# Patient Record
Sex: Female | Born: 1990 | Race: Asian | Hispanic: No | Marital: Married | Smoking: Never smoker
Health system: Southern US, Community
[De-identification: ages and names within clinical notes are randomized; demographics above are authoritative.]

## PROBLEM LIST (undated history)

## (undated) ENCOUNTER — Inpatient Hospital Stay (HOSPITAL_COMMUNITY): Payer: Self-pay

## (undated) DIAGNOSIS — Z789 Other specified health status: Secondary | ICD-10-CM

## (undated) DIAGNOSIS — O139 Gestational [pregnancy-induced] hypertension without significant proteinuria, unspecified trimester: Secondary | ICD-10-CM

## (undated) HISTORY — PX: NO PAST SURGERIES: SHX2092

---

## 2012-07-22 NOTE — L&D Delivery Note (Signed)
Delivery Note Pt rapidly progressed to complete dilation and pushed well.  At 6:03 AM a viable female was delivered via Vaginal, Spontaneous Delivery (Presentation: OA ).  APGARS to be assigned by NICU but baby crying vigorously; weight pending.   Placenta status:delivered spontaneously , .  Cord:  with the following complications: none  Anesthesia: None  Episiotomy: none Lacerations: none Suture Repair: N/a Est. Blood Loss (mL): 300cc  Mom to AICU.  Baby to NICU.  Oliver Pila 06/21/2013, 6:14 AM

## 2013-04-02 LAB — OB RESULTS CONSOLE ABO/RH: RH Type: POSITIVE

## 2013-04-02 LAB — OB RESULTS CONSOLE RUBELLA ANTIBODY, IGM: Rubella: IMMUNE

## 2013-04-02 LAB — OB RESULTS CONSOLE RPR: RPR: NONREACTIVE

## 2013-04-02 LAB — OB RESULTS CONSOLE HIV ANTIBODY (ROUTINE TESTING): HIV: NONREACTIVE

## 2013-04-06 LAB — OB RESULTS CONSOLE GC/CHLAMYDIA
Chlamydia: NEGATIVE
Gonorrhea: NEGATIVE

## 2013-05-20 LAB — OB RESULTS CONSOLE GBS: GBS: POSITIVE

## 2013-06-21 ENCOUNTER — Inpatient Hospital Stay (HOSPITAL_COMMUNITY)
Admission: AD | Admit: 2013-06-21 | Discharge: 2013-06-25 | DRG: 774 | Disposition: A | Discharge status: Home / Self Care | Payer: Medicaid Other | Source: Ambulatory Visit | Attending: Obstetrics and Gynecology | Admitting: Obstetrics and Gynecology

## 2013-06-21 ENCOUNTER — Inpatient Hospital Stay (HOSPITAL_COMMUNITY): Payer: Medicaid Other

## 2013-06-21 ENCOUNTER — Encounter (HOSPITAL_COMMUNITY): Payer: Self-pay | Admitting: *Deleted

## 2013-06-21 DIAGNOSIS — O99892 Other specified diseases and conditions complicating childbirth: Secondary | ICD-10-CM | POA: Diagnosis present

## 2013-06-21 DIAGNOSIS — D649 Anemia, unspecified: Secondary | ICD-10-CM | POA: Diagnosis not present

## 2013-06-21 DIAGNOSIS — IMO0002 Reserved for concepts with insufficient information to code with codable children: Principal | ICD-10-CM | POA: Diagnosis present

## 2013-06-21 DIAGNOSIS — Z22330 Carrier of Group B streptococcus: Secondary | ICD-10-CM

## 2013-06-21 DIAGNOSIS — Q513 Bicornate uterus: Secondary | ICD-10-CM

## 2013-06-21 DIAGNOSIS — O3400 Maternal care for unspecified congenital malformation of uterus, unspecified trimester: Secondary | ICD-10-CM | POA: Diagnosis present

## 2013-06-21 DIAGNOSIS — O2650 Maternal hypotension syndrome, unspecified trimester: Secondary | ICD-10-CM | POA: Diagnosis present

## 2013-06-21 DIAGNOSIS — O1490 Unspecified pre-eclampsia, unspecified trimester: Secondary | ICD-10-CM | POA: Diagnosis present

## 2013-06-21 DIAGNOSIS — O9903 Anemia complicating the puerperium: Secondary | ICD-10-CM | POA: Diagnosis not present

## 2013-06-21 DIAGNOSIS — O1493 Unspecified pre-eclampsia, third trimester: Secondary | ICD-10-CM

## 2013-06-21 HISTORY — DX: Other specified health status: Z78.9

## 2013-06-21 LAB — CBC WITH DIFFERENTIAL/PLATELET
Basophils Absolute: 0.1 10*3/uL (ref 0.0–0.1)
Basophils Relative: 0 % (ref 0–1)
Eosinophils Absolute: 0.7 10*3/uL (ref 0.0–0.7)
Eosinophils Relative: 5 % (ref 0–5)
Lymphocytes Relative: 24 % (ref 12–46)
Lymphs Abs: 3 10*3/uL (ref 0.7–4.0)
MCH: 25.6 pg — ABNORMAL LOW (ref 26.0–34.0)
MCV: 77.3 fL — ABNORMAL LOW (ref 78.0–100.0)
Neutrophils Relative %: 61 % (ref 43–77)
Platelets: 223 10*3/uL (ref 150–400)
RBC: 4.84 MIL/uL (ref 3.87–5.11)
RDW: 14 % (ref 11.5–15.5)
WBC: 12.6 10*3/uL — ABNORMAL HIGH (ref 4.0–10.5)

## 2013-06-21 LAB — COMPREHENSIVE METABOLIC PANEL
ALT: 18 U/L (ref 0–35)
AST: 28 U/L (ref 0–37)
AST: 34 U/L (ref 0–37)
Albumin: 2.3 g/dL — ABNORMAL LOW (ref 3.5–5.2)
Albumin: 2.3 g/dL — ABNORMAL LOW (ref 3.5–5.2)
Alkaline Phosphatase: 145 U/L — ABNORMAL HIGH (ref 39–117)
BUN: 8 mg/dL (ref 6–23)
CO2: 21 mEq/L (ref 19–32)
CO2: 20 mEq/L (ref 19–32)
Calcium: 8.5 mg/dL (ref 8.4–10.5)
Calcium: 8.3 mg/dL — ABNORMAL LOW (ref 8.4–10.5)
Chloride: 104 mEq/L (ref 96–112)
Creatinine, Ser: 0.63 mg/dL (ref 0.50–1.10)
GFR calc Af Amer: >90 mL/min (ref >90)
GFR calc non Af Amer: >90 mL/min (ref >90)
Glucose, Bld: 79 mg/dL (ref 70–99)
Glucose, Bld: 80 mg/dL (ref 70–99)
Potassium: 4.1 mEq/L (ref 3.5–5.1)
Sodium: 135 mEq/L (ref 135–145)
Total Bilirubin: 0.3 mg/dL (ref 0.3–1.2)
Total Protein: 5.7 g/dL — ABNORMAL LOW (ref 6.0–8.3)

## 2013-06-21 LAB — CBC
HCT: 35 % — ABNORMAL LOW (ref 36.0–46.0)
Hemoglobin: 12.5 g/dL (ref 12.0–15.0)
Hemoglobin: 11.5 g/dL — ABNORMAL LOW (ref 12.0–15.0)
MCH: 25.5 pg — ABNORMAL LOW (ref 26.0–34.0)
MCH: 25.6 pg — ABNORMAL LOW (ref 26.0–34.0)
MCHC: 32.8 g/dL (ref 30.0–36.0)
MCHC: 32.9 g/dL (ref 30.0–36.0)
MCV: 76.7 fL — ABNORMAL LOW (ref 78.0–100.0)
Platelets: 182 10*3/uL (ref 150–400)
RBC: 3.77 MIL/uL — ABNORMAL LOW (ref 3.87–5.11)
RDW: 14 % (ref 11.5–15.5)
WBC: 14.4 10*3/uL — ABNORMAL HIGH (ref 4.0–10.5)
WBC: 16.2 10*3/uL — ABNORMAL HIGH (ref 4.0–10.5)

## 2013-06-21 LAB — URINE MICROSCOPIC-ADD ON

## 2013-06-21 LAB — URIC ACID: Uric Acid, Serum: 7.3 mg/dL — ABNORMAL HIGH (ref 2.4–7.0)

## 2013-06-21 LAB — URINALYSIS, ROUTINE W REFLEX MICROSCOPIC
Glucose, UA: NEGATIVE mg/dL
Ketones, ur: NEGATIVE mg/dL
Leukocytes, UA: NEGATIVE
Specific Gravity, Urine: 1.025 (ref 1.005–1.030)
pH: 6.5 (ref 5.0–8.0)

## 2013-06-21 LAB — RPR: RPR Ser Ql: NONREACTIVE

## 2013-06-21 LAB — PROTEIN / CREATININE RATIO, URINE
Creatinine, Urine: 68.87 mg/dL
Protein Creatinine Ratio: 10.69 — ABNORMAL HIGH (ref 0.00–0.15)
Total Protein, Urine: 735.9 mg/dL

## 2013-06-21 LAB — TYPE AND SCREEN
ABO/RH(D): A POS
Antibody Screen: NEGATIVE

## 2013-06-21 MED ORDER — TETANUS-DIPHTH-ACELL PERTUSSIS 5-2.5-18.5 LF-MCG/0.5 IM SUSP
0.5000 mL | Freq: Once | INTRAMUSCULAR | Status: AC
  Administered 2013-06-22: 0.5 mL via INTRAMUSCULAR
  Filled 2013-06-21: qty 0.5

## 2013-06-21 MED ORDER — MAGNESIUM SULFATE 40 MG/ML IJ SOLN
4.0000 g | Freq: Once | INTRAMUSCULAR | Status: DC
  Administered 2013-06-21: 4 g via INTRAVENOUS

## 2013-06-21 MED ORDER — BUTORPHANOL TARTRATE 1 MG/ML IJ SOLN
1.0000 mg | Freq: Once | INTRAMUSCULAR | Status: AC
  Administered 2013-06-21: 1 mg via INTRAVENOUS
  Filled 2013-06-21: qty 1

## 2013-06-21 MED ORDER — PENICILLIN G POTASSIUM 5000000 UNITS IJ SOLR
2.5000 10*6.[IU] | INTRAVENOUS | Status: DC
  Filled 2013-06-21 (×2): qty 2.5

## 2013-06-21 MED ORDER — WITCH HAZEL-GLYCERIN EX PADS: 1.0000 "application " | MEDICATED_PAD | CUTANEOUS | Status: DC | PRN

## 2013-06-21 MED ORDER — PRENATAL MULTIVITAMIN CH
1.0000 | ORAL_TABLET | Freq: Every day | ORAL | Status: DC
  Administered 2013-06-21 – 2013-06-25 (×5): 1 via ORAL
  Filled 2013-06-21 (×6): qty 1

## 2013-06-21 MED ORDER — LACTATED RINGERS IV SOLN
INTRAVENOUS | Status: DC
  Administered 2013-06-21: 01:00:00 via INTRAVENOUS

## 2013-06-21 MED ORDER — ONDANSETRON HCL 4 MG/2ML IJ SOLN: 4.0000 mg | INTRAMUSCULAR | Status: DC | PRN

## 2013-06-21 MED ORDER — OXYTOCIN 40 UNITS IN LACTATED RINGERS INFUSION - SIMPLE MED
62.5000 mL/h | INTRAVENOUS | Status: DC
  Administered 2013-06-21: 62.5 mL/h via INTRAVENOUS
  Filled 2013-06-21: qty 1000

## 2013-06-21 MED ORDER — IBUPROFEN 600 MG PO TABS
600.0000 mg | ORAL_TABLET | Freq: Four times a day (QID) | ORAL | Status: DC | PRN
  Administered 2013-06-21: 600 mg via ORAL
  Filled 2013-06-21: qty 1

## 2013-06-21 MED ORDER — ONDANSETRON HCL 4 MG PO TABS: 4.0000 mg | ORAL_TABLET | ORAL | Status: DC | PRN

## 2013-06-21 MED ORDER — OXYTOCIN 40 UNITS IN LACTATED RINGERS INFUSION - SIMPLE MED
INTRAVENOUS | Status: AC
  Administered 2013-06-21: 40 [IU]
  Filled 2013-06-21: qty 1000

## 2013-06-21 MED ORDER — MISOPROSTOL 200 MCG PO TABS
ORAL_TABLET | ORAL | Status: AC
  Filled 2013-06-21: qty 3

## 2013-06-21 MED ORDER — DIPHENHYDRAMINE HCL 25 MG PO CAPS: 25.0000 mg | ORAL_CAPSULE | Freq: Four times a day (QID) | ORAL | Status: DC | PRN

## 2013-06-21 MED ORDER — MAGNESIUM SULFATE 40 G IN LACTATED RINGERS - SIMPLE: 2.0000 g/h | INTRAVENOUS | Status: DC

## 2013-06-21 MED ORDER — PENICILLIN G POTASSIUM 5000000 UNITS IJ SOLR
5.0000 10*6.[IU] | Freq: Once | INTRAVENOUS | Status: AC
  Administered 2013-06-21: 5 10*6.[IU] via INTRAVENOUS
  Filled 2013-06-21: qty 5

## 2013-06-21 MED ORDER — LACTATED RINGERS IV SOLN: 500.0000 mL | INTRAVENOUS | Status: DC | PRN

## 2013-06-21 MED ORDER — SIMETHICONE 80 MG PO CHEW: 80.0000 mg | CHEWABLE_TABLET | ORAL | Status: DC | PRN

## 2013-06-21 MED ORDER — DIBUCAINE 1 % RE OINT
1.0000 "application " | TOPICAL_OINTMENT | RECTAL | Status: DC | PRN
  Filled 2013-06-21: qty 28

## 2013-06-21 MED ORDER — LIDOCAINE HCL (PF) 1 % IJ SOLN
5.0000 mL | Freq: Once | INTRAMUSCULAR | Status: AC
  Administered 2013-06-21: 5 mL via SUBCUTANEOUS
  Filled 2013-06-21: qty 5

## 2013-06-21 MED ORDER — LACTATED RINGERS IV SOLN
INTRAVENOUS | Status: DC
  Administered 2013-06-21: 05:00:00 via INTRAVENOUS

## 2013-06-21 MED ORDER — ONDANSETRON HCL 4 MG/2ML IJ SOLN: 4.0000 mg | Freq: Four times a day (QID) | INTRAMUSCULAR | Status: DC | PRN

## 2013-06-21 MED ORDER — IBUPROFEN 600 MG PO TABS
600.0000 mg | ORAL_TABLET | Freq: Four times a day (QID) | ORAL | Status: DC
  Administered 2013-06-21 – 2013-06-25 (×14): 600 mg via ORAL
  Filled 2013-06-21 (×15): qty 1

## 2013-06-21 MED ORDER — CARBOPROST TROMETHAMINE 250 MCG/ML IM SOLN
250.0000 ug | Freq: Once | INTRAMUSCULAR | Status: AC
  Administered 2013-06-21: 250 ug via INTRAMUSCULAR
  Filled 2013-06-21: qty 1

## 2013-06-21 MED ORDER — BUTORPHANOL TARTRATE 1 MG/ML IJ SOLN
1.0000 mg | Freq: Once | INTRAMUSCULAR | Status: AC
  Administered 2013-06-22: 1 mg via INTRAVENOUS
  Filled 2013-06-21: qty 1

## 2013-06-21 MED ORDER — ACETAMINOPHEN 325 MG PO TABS: 650.0000 mg | ORAL_TABLET | ORAL | Status: DC | PRN

## 2013-06-21 MED ORDER — MISOPROSTOL 200 MCG PO TABS
400.0000 ug | ORAL_TABLET | Freq: Once | ORAL | Status: AC
  Administered 2013-06-21: 400 ug via VAGINAL

## 2013-06-21 MED ORDER — DIPHENOXYLATE-ATROPINE 2.5-0.025 MG PO TABS
2.0000 | ORAL_TABLET | Freq: Four times a day (QID) | ORAL | Status: DC | PRN
  Administered 2013-06-22: 2 via ORAL

## 2013-06-21 MED ORDER — MAGNESIUM SULFATE BOLUS VIA INFUSION
4.0000 g | Freq: Once | INTRAVENOUS | Status: DC
  Filled 2013-06-21: qty 500

## 2013-06-21 MED ORDER — BENZOCAINE-MENTHOL 20-0.5 % EX AERO
1.0000 "application " | INHALATION_SPRAY | CUTANEOUS | Status: DC | PRN
  Filled 2013-06-21: qty 56

## 2013-06-21 MED ORDER — LIDOCAINE HCL (PF) 1 % IJ SOLN
30.0000 mL | INTRAMUSCULAR | Status: DC | PRN
  Filled 2013-06-21: qty 30

## 2013-06-21 MED ORDER — ZOLPIDEM TARTRATE 5 MG PO TABS: 5.0000 mg | ORAL_TABLET | Freq: Every evening | ORAL | Status: DC | PRN

## 2013-06-21 MED ORDER — BUTORPHANOL TARTRATE 1 MG/ML IJ SOLN
1.0000 mg | INTRAMUSCULAR | Status: DC | PRN
  Administered 2013-06-21: 1 mg via INTRAVENOUS
  Filled 2013-06-21: qty 1

## 2013-06-21 MED ORDER — SENNOSIDES-DOCUSATE SODIUM 8.6-50 MG PO TABS
2.0000 | ORAL_TABLET | ORAL | Status: DC
  Administered 2013-06-24: 2 via ORAL
  Filled 2013-06-21: qty 2

## 2013-06-21 MED ORDER — OXYCODONE-ACETAMINOPHEN 5-325 MG PO TABS
1.0000 | ORAL_TABLET | ORAL | Status: DC | PRN
Start: 2013-06-21 — End: 2013-06-21

## 2013-06-21 MED ORDER — OXYCODONE-ACETAMINOPHEN 5-325 MG PO TABS
1.0000 | ORAL_TABLET | ORAL | Status: DC | PRN
Start: 2013-06-21 — End: 2013-06-25
  Administered 2013-06-22 (×2): 2 via ORAL
  Administered 2013-06-23: 1 via ORAL
  Filled 2013-06-21: qty 1
  Filled 2013-06-21 (×2): qty 2

## 2013-06-21 MED ORDER — CITRIC ACID-SODIUM CITRATE 334-500 MG/5ML PO SOLN: 30.0000 mL | ORAL | Status: DC | PRN

## 2013-06-21 MED ORDER — SODIUM CHLORIDE 0.45 % IV SOLN
INTRAVENOUS | Status: DC
  Administered 2013-06-21: 19:00:00 via INTRAVENOUS

## 2013-06-21 MED ORDER — OXYTOCIN BOLUS FROM INFUSION
500.0000 mL | INTRAVENOUS | Status: DC
  Administered 2013-06-21: 500 mL via INTRAVENOUS

## 2013-06-21 MED ORDER — LANOLIN HYDROUS EX OINT: TOPICAL_OINTMENT | CUTANEOUS | Status: DC | PRN

## 2013-06-21 MED ORDER — MAGNESIUM SULFATE 40 G IN LACTATED RINGERS - SIMPLE
2.0000 g/h | INTRAVENOUS | Status: DC
  Administered 2013-06-21 (×2): 2 g/h via INTRAVENOUS
  Filled 2013-06-21 (×2): qty 500

## 2013-06-21 NOTE — MAU Note (Signed)
Via EMS, limited English, EMS reports elevated B/P's in route. Per family pt has only history of kidney stones with this preg.

## 2013-06-21 NOTE — Progress Notes (Signed)
Patient ID: Stephanie Holmes, female   DOB: Mar 16, 1991, 22 y.o.   MRN: [id number] The pt had a syncopal episode in the restroom and was incontinent of stool.When she returned to her bed by wheelchair I examined her and found no blood clots in her uterus although the pt did not allow a good exam. Some material I took out looked like possible placental tissue. I will check her CBC and get an [place] to look for retained placenta. Her VS have been normal

## 2013-06-21 NOTE — MAU Provider Note (Signed)
History     CSN: [id number]  Arrival date and time: 06/21/13 0015   None     Chief Complaint  Patient presents with  . Abdominal Pain   HPI  Stephanie Holmes is a 22 y.o. G1P0 at [redacted]w[redacted]d who presents today with pain and bleeding. She reports that the pain is RUQ/epigastirc in nature. She states that the pain started at 1400. She has had bleeding since 1700. She denies any contraction. She denies any headache or blurry vision.   No past medical history on file.  No past surgical history on file.  No family history on file.  History  Substance Use Topics  . Smoking status: Not on file  . Smokeless tobacco: Not on file  . Alcohol Use: Not on file    Allergies: No Known Allergies  No prescriptions prior to admission    ROS Physical Exam   Blood pressure 153/90, pulse 83, temperature 98.3 F (36.8 C), temperature source Oral, resp. rate 20, SpO2 100.00%.  Physical Exam  Nursing note and vitals reviewed. Constitutional: She is oriented to person, place, and time. She appears well-developed and well-nourished. No distress.  Cardiovascular: Normal rate.   Respiratory: Effort normal.  GI: Soft. There is no tenderness.  Genitourinary:   Cervix: 4-5/70/-2  Neurological: She is alert and oriented to person, place, and time.  Skin: Skin is warm and dry. No erythema.  Psychiatric: She has a normal mood and affect.   FHT: 140, moderate with 15x15 accels, no decels Toco: no UCs  MAU Course  Procedures  Results for orders placed during the hospital encounter of 06/21/13 (from the past 24 hour(s))  URINALYSIS, ROUTINE W REFLEX MICROSCOPIC     Status: Abnormal   Collection Time    06/21/13 12:44 AM      Result Value Range   Color, Urine YELLOW  YELLOW   APPearance CLEAR  CLEAR   Specific Gravity, Urine 1.025  1.005 - 1.030   pH 6.5  5.0 - 8.0   Glucose, UA NEGATIVE  NEGATIVE mg/dL   Hgb urine dipstick MODERATE (*) NEGATIVE   Bilirubin Urine NEGATIVE  NEGATIVE   Ketones,  ur NEGATIVE  NEGATIVE mg/dL   Protein, ur >409 (*) NEGATIVE mg/dL   Urobilinogen, UA 0.2  0.0 - 1.0 mg/dL   Nitrite NEGATIVE  NEGATIVE   Leukocytes, UA NEGATIVE  NEGATIVE  URINE MICROSCOPIC-ADD ON     Status: Abnormal   Collection Time    06/21/13 12:44 AM      Result Value Range   Squamous Epithelial / LPF RARE  RARE   WBC, UA 0-2  <3 WBC/hpf   RBC / HPF 3-6  <3 RBC/hpf   Bacteria, UA FEW (*) RARE  CBC WITH DIFFERENTIAL     Status: Abnormal   Collection Time    06/21/13 12:55 AM      Result Value Range   WBC 12.6 (*) 4.0 - 10.5 K/uL   RBC 4.84  3.87 - 5.11 MIL/uL   Hemoglobin 12.4  12.0 - 15.0 g/dL   HCT 81.1  91.4 - 78.2 %   MCV 77.3 (*) 78.0 - 100.0 fL   MCH 25.6 (*) 26.0 - 34.0 pg   MCHC 33.2  30.0 - 36.0 g/dL   RDW 95.6  21.3 - 08.6 %   Platelets 223  150 - 400 K/uL   Neutrophils Relative % 61  43 - 77 %   Neutro Abs 7.7  1.7 - 7.7 K/uL  Lymphocytes Relative 24  12 - 46 %   Lymphs Abs 3.0  0.7 - 4.0 K/uL   Monocytes Relative 9  3 - 12 %   Monocytes Absolute 1.2 (*) 0.1 - 1.0 K/uL   Eosinophils Relative 5  0 - 5 %   Eosinophils Absolute 0.7  0.0 - 0.7 K/uL   Basophils Relative 0  0 - 1 %   Basophils Absolute 0.1  0.0 - 0.1 K/uL  COMPREHENSIVE METABOLIC PANEL     Status: Abnormal   Collection Time    06/21/13 12:55 AM      Result Value Range   Sodium 135  135 - 145 mEq/L   Potassium 4.1  3.5 - 5.1 mEq/L   Chloride 104  96 - 112 mEq/L   CO2 21  19 - 32 mEq/L   Glucose, Bld 79  70 - 99 mg/dL   BUN 9  6 - 23 mg/dL   Creatinine, Ser 1.61  0.50 - 1.10 mg/dL   Calcium 8.5  8.4 - 09.6 mg/dL   Total Protein 5.7 (*) 6.0 - 8.3 g/dL   Albumin 2.3 (*) 3.5 - 5.2 g/dL   AST 28  0 - 37 U/L   ALT 18  0 - 35 U/L   Alkaline Phosphatase 145 (*) 39 - 117 U/L   Total Bilirubin 0.2 (*) 0.3 - 1.2 mg/dL   GFR calc non Af Amer >90  >90 mL/min   GFR calc Af Amer >90  >90 mL/min  LACTATE DEHYDROGENASE     Status: None   Collection Time    06/21/13 12:55 AM      Result Value Range    LDH 227  94 - 250 U/L  URIC ACID     Status: Abnormal   Collection Time    06/21/13 12:55 AM      Result Value Range   Uric Acid, Serum 7.3 (*) 2.4 - 7.0 mg/dL   0454: D/W Dr. Senaida Ores, will wait to see results of CMET.  0137: Reviewed labs with Dr. Senaida Ores, will admit to labor and delivery, admission orders.  Assessment and Plan  Admit to labor delivery Admission orders   Tawnya Crook 06/21/2013, 1:27 AM

## 2013-06-21 NOTE — Progress Notes (Addendum)
Pt up to void for first time after delivery and passed out in bathroom. Add'l assistance requested and extra staff immediately in room. Pt difficult to arouse using cool cloths and ammonia inhalant. Vital signs remain stable while pt in bathroom and IV bolus of pitocin started. Pt placed in wheelchair and moved back to bed. Pt placed back into bed in low fowlers position. Pt became more responsive at this time. Fundus is firm at one above umbilicus, but large amt of clots seen in bathroom after transport. Pt was able to void small amt. Prior to passing out. Dr Senaida Ores called and came immediately to assess pt. Cytotec placed per Dr. Senaida Ores.  PPH protocol initiated.

## 2013-06-21 NOTE — H&P (Signed)
Stephanie Holmes is a 22 y.o. female G2P1001 at 53 1/7 weeks (EDD 08/01/13 by 22 week Korea inconsistent with LMP)  presenting by EMS for RUQ pain.  Pt came in with c/o RUQ pain and elevated BP.  MAU work-up revealed > 300 in urine but all labs are WNL.  BP 140-150/90's.  Prenatal care significant for late start at 22 weeks and dates set by an Korea at that time.  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  The patient is from [place] and speaks limited english, her father is translating and the language line does not have an available interpreter currently.  She had never seen an MD prior to this pregnancy and her last delivery was in [place].  On her visit 06/10/13 she had BP of 128/88 and 2+ proteinuria but no symptoms.  Urinary culture revealed +GBS and an antibiotic was called in.  Baseline BP at her first visit was 106/60.  Her weight gain was up about 10 lbs that visit as well.    Maternal Medical History:  Reason for admission: Nausea.   Contractions: Frequency: regular.   Perceived severity is mild.    Fetal activity: Perceived fetal activity is normal.    Prenatal complications: Pre-eclampsia.   Prenatal Complications - Diabetes: none.    OB History   Grav Para Term Preterm Abortions TAB SAB Ect Mult Living   2 1 1       1     2009 NSVD in [place] 5#12oz  No prenatal care  No past medical history on file. No past surgical history on file. Family History: family history is not on file. Social History:  has no tobacco, alcohol, and drug history on file.   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  Neurological: Negative for headaches.    Dilation: 4.5 Effacement (%): 70;60 Station: -2 Exam by:: B.Staley,RN Blood pressure 154/98, pulse 81, temperature 98.3 F (36.8 C), temperature source Oral, resp. rate 20, SpO2 100.00%. Maternal Exam:  Uterine Assessment: Contraction strength is mild.  Contraction frequency is regular.   Abdomen: Fetal presentation: vertex  Introitus: Normal vulva. Normal vagina.    Physical Exam  Constitutional: She is oriented to person, place, and time. She appears well-developed and well-nourished.  Cardiovascular: Normal rate.   Respiratory: Effort normal.  GI: Soft.  Genitourinary: Vagina normal.  Gravid  Neurological: She is alert and oriented to person, place, and time.  Psychiatric:  Appears uncomfortable    Prenatal labs: ABO, Rh:  A positive Antibody:  negative Rubella:  Immune RPR:   NR HBsAg:   Neg HIV:   NR GBS:   Positive in urine 06/10/13 One hour GTT 116 Too late to care for genetic screens  Assessment/Plan: Pt admitted with presumed preeclampsia given elevated BP and proteinuria.  BP are not quite in severe range and epigastric pain only other possible severe criteria.  Will start Magnesium and get [place] for growth of fetus.  Given normal LFT's, will also check gallbladder to be sure no other etiology for pain, though doubtful.  Cervix is 4-5 cm per RN exam, but patient states not feeling  her mild contractions.  Will observe for further cervical change and repeat labs in 5 hours from first set.  Once all information available, will see if MFM would recommend induction given poor dating.   Oliver Pila 06/21/2013, 2:07 AM

## 2013-06-21 NOTE — Progress Notes (Addendum)
Patient ID: Stephanie Holmes, female   DOB: 1990/10/03, 22 y.o.   MRN: [id number] Pt's US showed no gall bladder issues.  Baby normally grown, 4#8oz  30%ile Pt brought to L&D and became more uncomfortable, cervix changed to 6cm and SROM just now--received stadol On PCN for +GBS  BP still 150/90 on magnesium Repeat labs pending   Expect vaginal delivery

## 2013-06-21 NOTE — Progress Notes (Signed)
Patient ID: Stephanie Holmes, female   DOB: Jun 07, 1991, 22 y.o.   MRN: [id number] Pt has had Hb 9.6 VS  REMAIN STABLE.sHE WAS GIVEN HEMABATE AND IS PERSPIRING. [place] was done but pt refused endovaginal scanning. By abdominal US there was no evidence of retained POC I am going to examine her with a speculum to see if I can rule out a laceration and get an idea of how much she is bleeding

## 2013-06-21 NOTE — Progress Notes (Signed)
Provider on way to hospital

## 2013-06-21 NOTE — Progress Notes (Signed)
Patient ID: Stephanie Holmes, female   DOB: 1990/08/27, 22 y.o.   MRN: [id number] I examined the lady in an appropriate L&D bed There were some blood clots on the vulva and in the vagina that were removed. The cervix was exposed and there was some bleeding from the anterior cervix. This was sutured with 3-0 vicryl Exam revealed the uterus to have no clots or tissue palpable.There were bilateral lacerations at the introitus at 5 and 7 o'clock and the one at 5 o'clock had an arterial bleeder that was bleeding briskly. 1% xylocaine was used at both sites and the lacerations were repaired with 3-0 vicryl. At the end of the procedure the bleeding appeared minimal.The pt had received hemabate and during the procedure the pt passed many copious stools. I asked the pt questions and she answered them appropriately.

## 2013-06-21 NOTE — Progress Notes (Signed)
Patient ID: Stephanie Holmes, female   DOB: [birth year], 22 y.o.   MRN: [id number] CTSP for syncopal episode and passing of clots when up to restroom first time  Pt had gotten up to restroom was doing well prior to that with mnimal bleeding In restroom passed about 500cc of clot and blood and got syncopal.  Was assisted to bed and vitals stable  BP 125/78 and pulse 60-70 When I arrived, pt was back in bed.  Bimanual massage performed and clot removed from lower uterine segment, fundus felt contracted I could not get all the way in the fundus to r/o retained products, but placenta delivery was uneventful  Cytotec placed vaginally  Pitocin still going in IVF  Will observe pt closely  pp hemorrhage protocol activated in case she has further significant bleeding Starting hemoglobin good at 12.

## 2013-06-22 LAB — COMPREHENSIVE METABOLIC PANEL
Albumin: 1.8 g/dL — ABNORMAL LOW (ref 3.5–5.2)
Alkaline Phosphatase: 99 U/L (ref 39–117)
BUN: 10 mg/dL (ref 6–23)
Calcium: 7 mg/dL — ABNORMAL LOW (ref 8.4–10.5)
Chloride: 103 mEq/L (ref 96–112)
Creatinine, Ser: 0.72 mg/dL (ref 0.50–1.10)
GFR calc Af Amer: >90 mL/min (ref >90)
Glucose, Bld: 126 mg/dL — ABNORMAL HIGH (ref 70–99)
Total Bilirubin: 0.1 mg/dL — ABNORMAL LOW (ref 0.3–1.2)

## 2013-06-22 LAB — CBC
HCT: 27.8 % — ABNORMAL LOW (ref 36.0–46.0)
MCH: 25.6 pg — ABNORMAL LOW (ref 26.0–34.0)
MCHC: 33.1 g/dL (ref 30.0–36.0)
MCV: 77.2 fL — ABNORMAL LOW (ref 78.0–100.0)
RDW: 14.2 % (ref 11.5–15.5)
WBC: 17.2 10*3/uL — ABNORMAL HIGH (ref 4.0–10.5)

## 2013-06-22 MED ORDER — SODIUM CHLORIDE 0.9 % IV SOLN
3.0000 g | Freq: Four times a day (QID) | INTRAVENOUS | Status: AC
  Administered 2013-06-22 (×3): 3 g via INTRAVENOUS
  Filled 2013-06-22 (×3): qty 3

## 2013-06-22 NOTE — Progress Notes (Signed)
Post Partum Day 1  Events of last pm noted.  Pt has had minimal bleeding since hemobate and suture of vaginal bleeder.  She had a lot of loose stools last PM from the hemobate, but is better now. She has not attempted ambulation again.  She looks much more alert and rested this AM. BP stable.  Labs this AM WNL and Hgb stable at 9.2.    Subjective: Alert and rested.  Asking to see baby  Objective: Blood pressure 125/74, pulse 77, temperature 97.9 F (36.6 C), temperature source Oral, resp. rate 20, height 5\' 5"  (1.651 m), weight 97.659 kg (215 lb 4.8 oz), SpO2 97.00%. UOP excellent Physical Exam:  Fundus firm tender Minimal VB   Recent Labs  06/21/13 2120 06/22/13 0530  HGB 9.6* 9.2*  HCT 28.9* 27.8*    Assessment/Plan: BP improved and pt diuresing well.  I am going to d/c her magnesium.  Fundus is a bit tender, may just be from multiple exams--but I am going to cover her against endometritis with unasyn for 3 doses.  Will leave foley in until we see if she can ambulate without syncope.  Do not know if syncope was from magnesium since Hgb fairly stable.  Will see how she does ambulating with assistance later this AM when magnesium out of system.   LOS: 1 day   Desta Bujak W 06/22/2013, 7:02 AM

## 2013-06-22 NOTE — Progress Notes (Addendum)
Multiple loose stools past 2.5 hrs

## 2013-06-22 NOTE — Progress Notes (Signed)
Pt assisted up to BR to void with RN in attendance at 2020.  Ambulated to BR with no difficulty but when sat on commode became syncopal, diaphoretic and lost consciousness.  No seizure activity noted.  Held by RN until further assistance arrived - no fall.  RN present entire time.   Stooled (loose) and voided (bloody) while out.  Pulse palpated strong and regular, breathing regular and unlabored.  Wakened with ammonia capsule,  Assisted in wheelchair and back to bed.  Stood and turned, pulled self up in bed.  Peri care, cleaned large amount soft stool.  Mod amt vaginal bright red vaginal bleeding noted on transfer back to bed.  Dr. Ambrose Mantle present on floor and aware of syncopal episode, vaginal bleeding.  VSS.  Language barrier - mother present who speaks some English - explained to Mother what happened and POC.  Language line unavailable - does not have appropriate dialect for pts native language.

## 2013-06-22 NOTE — Progress Notes (Signed)
UR chart review completed.  

## 2013-06-22 NOTE — Progress Notes (Signed)
Verbal report given to A. Louellen Molder, RN on [place] - pt transferred to room 320 ambulatory acc by RN.

## 2013-06-22 NOTE — Progress Notes (Addendum)
Portable U/S, stat H/H obtained per orders Dr. Ambrose Mantle - waiting for results.

## 2013-06-22 NOTE — Progress Notes (Signed)
Pt moved to 373 and placed in L/D bed which allowed for bed to be broken down for more thorough  vaginal examination per Dr. Ambrose Mantle.  Pt was medicated for pain and to increase comfort during exam.  Placed in stirrups and vaginal lacerations noted and repaired per Dr. Ambrose Mantle.  Vaginal bleeding decreased, U/1 and firm at this time.  Passed large amount loose stool during exam.  Compliant and appropriate verbally.  Family at [facility].

## 2013-06-23 NOTE — Progress Notes (Signed)
Post Partum Day 1 PTD and preeclampsia Subjective: Pt looks very good today.  States some cramping, but overall good.  Pumping to feed baby who is stable in NICU. Objective: Blood pressure 118/79, pulse 100, temperature 98.3 F (36.8 C), temperature source Oral, resp. rate 18, height 5\' 5"  (1.651 m), weight 94.915 kg (209 lb 4 oz), SpO2 98.00%.  Physical Exam:  General: alert and cooperative Lochia: appropriate Uterine Fundus: firm   Recent Labs  06/21/13 2120 06/22/13 0530  HGB 9.6* 9.2*  HCT 28.9* 27.8*    Assessment/Plan: Plan for discharge tomorrow   LOS: 2 days   Ambermarie Honeyman W 06/23/2013, 8:52 AM

## 2013-06-23 NOTE — Discharge Summary (Addendum)
Obstetric Discharge Summary Reason for Admission: onset of labor and preeclampsia Prenatal Procedures: Preeclampsia Intrapartum Procedures: spontaneous vaginal delivery Postpartum Procedures: postpartum hemorrhage and suturing of vagina Complications-Operative and Postpartum: vaginal laceration Hemoglobin  Date Value Range Status  06/25/2013 6.7* 12.0 - 15.0 g/dL Final     REPEATED TO VERIFY     CRITICAL RESULT CALLED TO, READ BACK BY AND VERIFIED WITH:     CALDWELL, RN, @ 0615 ON 06/25/2013 BY [email or web address]     HCT  Date Value Range Status  06/25/2013 20.7* 36.0 - 46.0 % Final    Physical Exam:  General: alert and cooperative Lochia: appropriate Uterine Fundus: firm   Discharge Diagnoses: Premature labor and Preelampsia                                         Preterm delivery at 34+ weeks                                         Bicornuate uterus                                         Postpartum bleeding                                         Anemia Discharge Information: Date: 06/25/2013 Activity: pelvic rest Diet: routine Medications: Ibuprofen and Ferrous sulfate Condition: improved Instructions: refer to practice specific booklet Discharge to: home Follow-up Information   Follow up with Oliver Pila, MD. Schedule an appointment as soon as possible for a visit in 1 week. (Blood pressure and hemoglobin check)    Specialty:  Obstetrics and Gynecology   Contact information:   [address], [address] [place] [address] [phone]       Newborn Data: Live born female  Birth Weight: 4 lb 12.5 oz (2169 g) APGAR: 8, 9  Stable in NICU Stephanie Holmes 06/25/2013, 8:52 AM

## 2013-06-23 NOTE — Lactation Note (Addendum)
This note was copied from the chart of Stephanie Aviva Kluver. Lactation Consultation Note   Initial consuslt with this mom of a NICU baby, now 54 hours post partum, and 34 3/7 weeks corrected gestation. Mom has been pumping, and transitioned into mature milk today. Teaching done on pumpin and part care. I will call [facility] for mom and see if she is active or needs to apply, so mom will go home tomorrow with a DEP. I swabbed baby's mouth with 0.5 mls of mom's transitional milk today, and baby tolerated well.    1400 - I called WIC and spoke to Reader - se checked for mom, and mom is not active with WIC. I will call back with mom tomorrow, after she is discharged, to set up a dat for mom to apply, and then I will be able to loan mom a DEP on discharge tomorrow.   Patient Name: Stephanie Holmes [id number] Date: 06/23/2013 Reason for consult: Initial assessment;NICU baby   Maternal Data Formula Feeding for Exclusion: Yes (baby in NICU) Reason for exclusion: Admission to Intensive Care Unit (ICU) post-partum Infant to breast within first hour of birth: No Breastfeeding delayed due to:: Infant status Has patient been taught Hand Expression?: Yes Does the patient have breastfeeding experience prior to this delivery?: Yes  Feeding Feeding Type: Breast Milk  LATCH Score/Interventions          Comfort (Breast/Nipple): Engorged, cracked, bleeding, large blisters, severe discomfort Problem noted: Engorgment Intervention(s): Ice           Lactation Tools Discussed/Used Tools: Pump WIC Program: No (I wil call WIC for mom today for DEP and pplication) Pump Review: Setup, frequency, and cleaning;Milk Storage;Other (comment) (hand expression, teaching on providing EBm for a NICU baby) Initiated by:: bedside rn   Consult Status Consult Status: Follow-up Date: 06/24/13 Follow-up type: In-patient    Alfred Levins 06/23/2013, 11:00 AM

## 2013-06-24 LAB — COMPREHENSIVE METABOLIC PANEL
AST: 88 U/L — ABNORMAL HIGH (ref 0–37)
Albumin: 2.3 g/dL — ABNORMAL LOW (ref 3.5–5.2)
Alkaline Phosphatase: 99 U/L (ref 39–117)
BUN: 13 mg/dL (ref 6–23)
CO2: 27 mEq/L (ref 19–32)
Calcium: 9 mg/dL (ref 8.4–10.5)
Chloride: 105 mEq/L (ref 96–112)
Creatinine, Ser: 0.7 mg/dL (ref 0.50–1.10)
GFR calc non Af Amer: >90 mL/min (ref >90)
Glucose, Bld: 75 mg/dL (ref 70–99)
Total Bilirubin: 0.1 mg/dL — ABNORMAL LOW (ref 0.3–1.2)

## 2013-06-24 LAB — CBC
HCT: 23.5 % — ABNORMAL LOW (ref 36.0–46.0)
MCV: 79.9 fL (ref 78.0–100.0)
RBC: 2.94 MIL/uL — ABNORMAL LOW (ref 3.87–5.11)
RDW: 14.7 % (ref 11.5–15.5)
WBC: 14.2 10*3/uL — ABNORMAL HIGH (ref 4.0–10.5)

## 2013-06-24 MED ORDER — PANTOPRAZOLE SODIUM 40 MG PO TBEC
40.0000 mg | DELAYED_RELEASE_TABLET | Freq: Every day | ORAL | Status: DC
  Administered 2013-06-24 – 2013-06-25 (×2): 40 mg via ORAL
  Filled 2013-06-24 (×2): qty 1

## 2013-06-24 MED ORDER — FERROUS SULFATE 325 (65 FE) MG PO TABS
325.0000 mg | ORAL_TABLET | Freq: Two times a day (BID) | ORAL | Status: DC
  Administered 2013-06-24 – 2013-06-25 (×2): 325 mg via ORAL
  Filled 2013-06-24 (×2): qty 1

## 2013-06-24 NOTE — Progress Notes (Signed)
Post Partum Day 2 Subjective: Pt had some epigastric pain this AM, improved after protonix.  Able to ambulate without dizziness, visiting baby in NICU and feeling well.  Bleeding minimal Objective: Blood pressure 142/81, pulse 108, temperature 98.2 F (36.8 C), temperature source Oral, resp. rate 18, height 5\' 5"  (1.651 m), weight 95.255 kg (210 lb), SpO2 100.00%.  Physical Exam:  General: alert and cooperative Lochia: appropriate Uterine Fundus: firm LFT's slightly elevated to 50-80 Plts WNL.     Recent Labs  06/22/13 0530 06/24/13 0750  HGB 9.2* 7.5*  HCT 27.8* 23.5*    Assessment/Plan: Pt s/p NSVD preeclampsia BP are a bit higher at 140/90's and LFT's with slight bump.  No symptoms currently. Will keep patient in-house another day and monitor labs and BP to make sure not worsening. Baby stable in NICU   LOS: 3 days   Lama Narayanan W 06/24/2013, 9:33 AM

## 2013-06-24 NOTE — Lactation Note (Signed)
This note was copied from the chart of Stephanie Aviva Kluver. Lactation Consultation Note     Follow up consult with this mom of a NICU baby, now 3 days post partum,     Mom is still engorged on the right, and had not pumped in over 5 hours. I had her pump, and with her dad in the room, he explained she needs to pump 8 times a day, every 3 hours, and to ice every hour for 20 minutes. Mom expressed 3 ounces from ehr left breast, and just over 1 from her right.  Mom needs to apply to [facility]. I called but could not get through, so I faxed mom's information to [facility]. Mom does not have a phone, so I had to put her dad's number on the fax. I explained the loaner progran to MGF, and he is going to give me the $30 for a loaner DEP for his daughter.   Patient Name: Stephanie Holmes [id number] Date: 06/24/2013 Reason for consult: Follow-up assessment;NICU baby   Maternal Data    Feeding Feeding Type: Breast Fed Length of feed: 20 min  LATCH Score/Interventions          Problem noted: Engorgment Intervention(s): Ice           Lactation Tools Discussed/Used Pump Review: Setup, frequency, and cleaning   Consult Status Consult Status: Follow-up Date: 06/25/13 Follow-up type: In-patient    Alfred Levins 06/24/2013, 12:59 PM

## 2013-06-25 ENCOUNTER — Ambulatory Visit: Payer: Self-pay

## 2013-06-25 LAB — COMPREHENSIVE METABOLIC PANEL
ALT: 57 U/L — ABNORMAL HIGH (ref 0–35)
AST: 59 U/L — ABNORMAL HIGH (ref 0–37)
Albumin: 2.2 g/dL — ABNORMAL LOW (ref 3.5–5.2)
CO2: 24 mEq/L (ref 19–32)
Calcium: 8.4 mg/dL (ref 8.4–10.5)
Chloride: 105 mEq/L (ref 96–112)
Creatinine, Ser: 0.79 mg/dL (ref 0.50–1.10)
GFR calc non Af Amer: >90 mL/min (ref >90)
Sodium: 137 mEq/L (ref 135–145)
Total Bilirubin: 0.2 mg/dL — ABNORMAL LOW (ref 0.3–1.2)
Total Protein: 5.4 g/dL — ABNORMAL LOW (ref 6.0–8.3)

## 2013-06-25 LAB — CBC
HCT: 20.7 % — ABNORMAL LOW (ref 36.0–46.0)
MCH: 26 pg (ref 26.0–34.0)
MCV: 80.2 fL (ref 78.0–100.0)
Platelets: 232 10*3/uL (ref 150–400)
RBC: 2.58 MIL/uL — ABNORMAL LOW (ref 3.87–5.11)
RDW: 14.9 % (ref 11.5–15.5)
WBC: 10.9 10*3/uL — ABNORMAL HIGH (ref 4.0–10.5)

## 2013-06-25 MED ORDER — FERROUS SULFATE 325 (65 FE) MG PO TABS: 325.0000 mg | ORAL_TABLET | Freq: Two times a day (BID) | ORAL | Status: DC

## 2013-06-25 MED ORDER — IBUPROFEN 600 MG PO TABS: 600.0000 mg | ORAL_TABLET | Freq: Four times a day (QID) | ORAL | Status: DC

## 2013-06-25 NOTE — Lactation Note (Signed)
This note was copied from the chart of Stephanie Aviva Kluver. Lactation Consultation Note    Follow up consult with this mom of a NICU baby, now 34 5/7 weeks corrected gestation and 74 days old. Mom reports her breast engorgement much better, and is pumpin up to 8 ounces at a time. I loaned her a dEP, and instructed mom in it's use. Mom knows to bring her pump kit with her when she visits  The baby, so she can pump in the nICU. I spoke to [employer], from [facility], and she is going to set up an application appointment,  with mom - this may not be for over a week. I spoke to mom's sister, to explain the [facility] loaner , and that mom will return the pump and get her $30 back. I will follow this family in the NICU.   Patient Name: Stephanie Holmes [id number] Date: 06/25/2013     Maternal Data    Feeding Feeding Type: Breast Milk Length of feed: 30 min  LATCH Score/Interventions                      Lactation Tools Discussed/Used     Consult Status      Alfred Levins 06/25/2013, 5:03 PM

## 2013-06-25 NOTE — Progress Notes (Signed)
Post Partum Day 3 Subjective: Pt doing very well.  Ambulating to nursery to see baby and denies dizziness with that.  No headaches or epigastric pain. Minimal VB  Objective: Blood pressure 141/87, pulse 101, temperature 97.9 F (36.6 C), temperature source Oral, resp. rate 18, height 5\' 5"  (1.651 m), weight 92.987 kg (205 lb), SpO2 95.00%.  Physical Exam:  General: alert and cooperative Lochia: appropriate Uterine Fundus: firm    Recent Labs  06/24/13 0750 06/25/13 0520  HGB 7.5* 6.7*  HCT 23.5* 20.7*   LFT's improved Hgb dropped a bit more--d/w Dr. Ambrose Mantle and he feels a true representation of the bleeding she did when he was called to examine her.  Clinically she looks good.  Assessment/Plan: Pt stable from preeclampsia standpoint.  BP 140/90's at times but no higher.  LFT's improved.   Pt anemic from her acute blood loss, but clinically doing very well.  Will recheck 6 hours from last draw and ensure stable before d/c Have pt return to office in one week for recheck or prn  LOS: 4 days   Jamesyn Lindell W 06/25/2013, 8:41 AM

## 2013-06-25 NOTE — Progress Notes (Signed)
CRITICAL VALUE ALERT  Critical value received:  Hemoglobin 6.7   Date of notification:  06/25/13  Time of notification:  0614  Critical value read back:yes  Nurse who received alert:  Selinda Michaels, RN    MD notified (1st page):  Dr. Senaida Ores   Time of first page:  0615  MD notified (2nd page):  Time of second page:  Responding MD:  Dr. Senaida Ores   Time MD responded:  [phone]   No new orders given at this time.

## 2013-06-25 NOTE — Progress Notes (Signed)
Pt's father here to take pt home at 11:45.  Explained to pt and her father that the doctor wanted 12:00 lab drawn before discharge.  Father concerned about getting to work on time at 1:00pm.  Phoned Dr. Senaida Ores at 12:00 and explained above situation.  She stated that pt could leave after lab was drawn.   Pt discharged to home with father at 12:05 with lab already drawn.  Pt ambulated to car with Dolphus Jenny, NT.  Pt home with rented breast pump.  No equipment for home ordered at discharge.  Phoned Dr. Senaida Ores at 12:15 with H&H results.  Pt will follow-up in office in one week.

## 2014-05-23 ENCOUNTER — Encounter (HOSPITAL_COMMUNITY): Payer: Self-pay | Admitting: *Deleted

## 2015-01-02 ENCOUNTER — Emergency Department (INDEPENDENT_AMBULATORY_CARE_PROVIDER_SITE_OTHER)
Admission: EM | Admit: 2015-01-02 | Discharge: 2015-01-02 | Disposition: A | Discharge status: Home / Self Care | Payer: Self-pay | Source: Home / Self Care | Attending: Family Medicine | Admitting: Family Medicine

## 2015-01-02 ENCOUNTER — Encounter (HOSPITAL_COMMUNITY): Payer: Self-pay | Admitting: Emergency Medicine

## 2015-01-02 DIAGNOSIS — K088 Other specified disorders of teeth and supporting structures: Secondary | ICD-10-CM

## 2015-01-02 DIAGNOSIS — K0889 Other specified disorders of teeth and supporting structures: Secondary | ICD-10-CM

## 2015-01-02 MED ORDER — HYDROCODONE-ACETAMINOPHEN 5-325 MG PO TABS: 1.0000 | ORAL_TABLET | Freq: Four times a day (QID) | ORAL | Status: DC | PRN

## 2015-01-02 MED ORDER — AMOXICILLIN 500 MG PO CAPS: 500.0000 mg | ORAL_CAPSULE | Freq: Three times a day (TID) | ORAL | Status: DC

## 2015-01-02 NOTE — ED Notes (Addendum)
Via husband who speaks [place]  Pt c/o left upper dental pain onset 2 days associated w/HA Denies fevers, chills Pt is breastfeeding Alert, no signs of acute distress.

## 2015-01-02 NOTE — ED Provider Notes (Signed)
Stephanie Holmes is a 24 y.o. female who presents to Urgent Care today for tooth pain. Patient has severe upper left tooth pain present for 2 days. Patient thinks her tooth broke. She's tried Aleve which helps some. She is currently breast-feeding a-year-old infant.   Past Medical History  Diagnosis Date  . Medical history non-contributory    History reviewed. No pertinent past surgical history. History  Substance Use Topics  . Smoking status: Never Smoker   . Smokeless tobacco: Not on file  . Alcohol Use: No   ROS as above Medications: No current facility-administered medications for this encounter.   Current Outpatient Prescriptions  Medication Sig Dispense Refill  . amoxicillin (AMOXIL) 500 MG capsule Take 1 capsule (500 mg total) by mouth 3 (three) times daily. 21 capsule 0  . ferrous sulfate 325 (65 FE) MG tablet Take 1 tablet (325 mg total) by mouth 2 (two) times daily with a meal. 60 tablet 3  . HYDROcodone-acetaminophen (NORCO/VICODIN) 5-325 MG per tablet Take 1 tablet by mouth every 6 (six) hours as needed. 15 tablet 0  . ibuprofen (ADVIL,MOTRIN) 600 MG tablet Take 1 tablet (600 mg total) by mouth every 6 (six) hours. 30 tablet 0  . Prenatal Vit-Fe Fumarate-FA (PRENATAL MULTIVITAMIN) TABS tablet Take 1 tablet by mouth daily at 12 noon.     No Known Allergies   Exam:  BP 121/66 mmHg  Pulse 86  Temp(Src) 98.4 F (36.9 C) (Oral)  Resp 16  SpO2 100%  Breastfeeding? Yes Gen: Well NAD HEENT: EOMI,  MMM impacted broken upper left wisdom tooth tender to touch with gumline erythema. No abscess visible. Lungs: Normal work of breathing. CTABL Heart: RRR no MRG Abd: NABS, Soft. Nondistended, Nontender Exts: Brisk capillary refill, warm and well perfused.   No results found for this or any previous visit (from the past 24 hour(s)). No results found.  Assessment and Plan: 24 y.o. female with tooth pain. Treat with amoxicillin and Norco follow-up with dentist.  Discussed warning  signs or symptoms. Please see discharge instructions. Patient expresses understanding.     Gregor Hams, MD 01/02/15 [phone]

## 2015-01-02 NOTE — Discharge Instructions (Signed)
Thank you for coming in today.   Dental Pain A tooth ache may be caused by cavities (tooth decay). Cavities expose the nerve of the tooth to air and hot or cold temperatures. It may come from an infection or abscess (also called a boil or furuncle) around your tooth. It is also often caused by dental caries (tooth decay). This causes the pain you are having. DIAGNOSIS  Your caregiver can diagnose this problem by exam. TREATMENT   If caused by an infection, it may be treated with medications which kill germs (antibiotics) and pain medications as prescribed by your caregiver. Take medications as directed.  Only take over-the-counter or prescription medicines for pain, discomfort, or fever as directed by your caregiver.  Whether the tooth ache today is caused by infection or dental disease, you should see your dentist as soon as possible for further care. SEEK MEDICAL CARE IF: The exam and treatment you received today has been provided on an emergency basis only. This is not a substitute for complete medical or dental care. If your problem worsens or new problems (symptoms) appear, and you are unable to meet with your dentist, call or return to this location. SEEK IMMEDIATE MEDICAL CARE IF:   You have a fever.  You develop redness and swelling of your face, jaw, or neck.  You are unable to open your mouth.  You have severe pain uncontrolled by pain medicine. MAKE SURE YOU:   Understand these instructions.  Will watch your condition.  Will get help right away if you are not doing well or get worse. Document Released: 07/08/2005 Document Revised: 09/30/2011 Document Reviewed: 02/24/2008 [facility] Patient Information 2015 [place], [place]. This information is not intended to replace advice given to you by your health care provider. Make sure you discuss any questions you have with your health care provider.   [place]:  [place] [phone] (ext  [phone])  [place]  Please call Dr. Donn Pierini office [phone] or cell [phone] [address], [place] for tooth removal $200 includes exam, Xray, and extraction and follow up visit.  Bring list of current medications with you.   [place] - [place]  [address]  [place], [place], [place], [address]  [phone], Ext. [phone]  2nd and 4th Thursday of the month at 6:30am (Simple extractions only - no wisdom teeth or surgery) First come/First serve -First 10 clients served  [facility] [place], [place] residents only)  [address] B[address] [place], [place], [address]  [phone]  [place]  [phone]  [facility]  [phone]  Please call [facility] at [phone] to get the details to get your tooth pulled.

## 2015-01-26 DIAGNOSIS — R531 Weakness: Secondary | ICD-10-CM | POA: Insufficient documentation

## 2015-01-26 DIAGNOSIS — R5383 Other fatigue: Secondary | ICD-10-CM | POA: Insufficient documentation

## 2015-01-26 DIAGNOSIS — N12 Tubulo-interstitial nephritis, not specified as acute or chronic: Secondary | ICD-10-CM | POA: Insufficient documentation

## 2015-01-26 DIAGNOSIS — Z3202 Encounter for pregnancy test, result negative: Secondary | ICD-10-CM | POA: Insufficient documentation

## 2015-01-27 ENCOUNTER — Emergency Department (HOSPITAL_COMMUNITY)
Admission: EM | Admit: 2015-01-27 | Discharge: 2015-01-27 | Disposition: A | Discharge status: Home / Self Care | Payer: Self-pay | Attending: Emergency Medicine | Admitting: Emergency Medicine

## 2015-01-27 ENCOUNTER — Emergency Department (HOSPITAL_COMMUNITY): Payer: Self-pay

## 2015-01-27 ENCOUNTER — Encounter (HOSPITAL_COMMUNITY): Payer: Self-pay

## 2015-01-27 DIAGNOSIS — N12 Tubulo-interstitial nephritis, not specified as acute or chronic: Secondary | ICD-10-CM

## 2015-01-27 LAB — URINALYSIS, ROUTINE W REFLEX MICROSCOPIC
BILIRUBIN URINE: NEGATIVE
Glucose, UA: NEGATIVE mg/dL
KETONES UR: NEGATIVE mg/dL
NITRITE: POSITIVE — AB
PH: 5 (ref 5.0–8.0)
PROTEIN: 100 mg/dL — AB
Specific Gravity, Urine: 1.026 (ref 1.005–1.030)
Urobilinogen, UA: 0.2 mg/dL (ref 0.0–1.0)

## 2015-01-27 LAB — COMPREHENSIVE METABOLIC PANEL
ALK PHOS: 71 U/L (ref 38–126)
ALT: 24 U/L (ref 14–54)
ANION GAP: 8 (ref 5–15)
AST: 19 U/L (ref 15–41)
Albumin: 4 g/dL (ref 3.5–5.0)
BILIRUBIN TOTAL: 0.3 mg/dL (ref 0.3–1.2)
BUN: 7 mg/dL (ref 6–20)
CHLORIDE: 104 mmol/L (ref 101–111)
CO2: 26 mmol/L (ref 22–32)
Calcium: 9.3 mg/dL (ref 8.9–10.3)
Creatinine, Ser: 0.64 mg/dL (ref 0.44–1.00)
GFR calc Af Amer: >60 mL/min (ref >60)
GFR calc non Af Amer: >60 mL/min (ref >60)
Glucose, Bld: 113 mg/dL — ABNORMAL HIGH (ref 65–99)
Potassium: 4 mmol/L (ref 3.5–5.1)
Sodium: 138 mmol/L (ref 135–145)
Total Protein: 7.5 g/dL (ref 6.5–8.1)

## 2015-01-27 LAB — CBC WITH DIFFERENTIAL/PLATELET
BASOS PCT: 1 % (ref 0–1)
Basophils Absolute: 0.1 10*3/uL (ref 0.0–0.1)
EOS ABS: 1 10*3/uL — AB (ref 0.0–0.7)
Eosinophils Relative: 9 % — ABNORMAL HIGH (ref 0–5)
HCT: 41.1 % (ref 36.0–46.0)
Hemoglobin: 12.7 g/dL (ref 12.0–15.0)
Lymphocytes Relative: 27 % (ref 12–46)
Lymphs Abs: 3 10*3/uL (ref 0.7–4.0)
MCH: 25.3 pg — ABNORMAL LOW (ref 26.0–34.0)
MCHC: 30.9 g/dL (ref 30.0–36.0)
MCV: 81.9 fL (ref 78.0–100.0)
MONOS PCT: 8 % (ref 3–12)
Monocytes Absolute: 0.9 10*3/uL (ref 0.1–1.0)
NEUTROS PCT: 55 % (ref 43–77)
Neutro Abs: 6.3 10*3/uL (ref 1.7–7.7)
PLATELETS: 349 10*3/uL (ref 150–400)
RBC: 5.02 MIL/uL (ref 3.87–5.11)
RDW: 13.5 % (ref 11.5–15.5)
WBC: 11.2 10*3/uL — ABNORMAL HIGH (ref 4.0–10.5)

## 2015-01-27 LAB — POC URINE PREG, ED: PREG TEST UR: NEGATIVE

## 2015-01-27 LAB — LIPASE, BLOOD: Lipase: 34 U/L (ref 22–51)

## 2015-01-27 LAB — URINE MICROSCOPIC-ADD ON

## 2015-01-27 LAB — I-STAT BETA HCG BLOOD, ED (MC, WL, AP ONLY): I-stat hCG, quantitative: <5 m[IU]/mL (ref <5)

## 2015-01-27 LAB — I-STAT TROPONIN, ED: TROPONIN I, POC: 0 ng/mL (ref 0.00–0.08)

## 2015-01-27 MED ORDER — IBUPROFEN 600 MG PO TABS: 600.0000 mg | ORAL_TABLET | Freq: Four times a day (QID) | ORAL | Status: DC | PRN

## 2015-01-27 MED ORDER — SODIUM CHLORIDE 0.9 % IV BOLUS (SEPSIS)
1000.0000 mL | Freq: Once | INTRAVENOUS | Status: AC
  Administered 2015-01-27: 1000 mL via INTRAVENOUS

## 2015-01-27 MED ORDER — DEXTROSE 5 % IV SOLN
1.0000 g | Freq: Once | INTRAVENOUS | Status: AC
  Administered 2015-01-27: 1 g via INTRAVENOUS
  Filled 2015-01-27: qty 10

## 2015-01-27 MED ORDER — CEPHALEXIN 500 MG PO CAPS: 500.0000 mg | ORAL_CAPSULE | Freq: Four times a day (QID) | ORAL | Status: DC

## 2015-01-27 MED ORDER — IOHEXOL 300 MG/ML  SOLN
100.0000 mL | Freq: Once | INTRAMUSCULAR | Status: AC | PRN
  Administered 2015-01-27: 100 mL via INTRAVENOUS

## 2015-01-27 MED ORDER — IOHEXOL 300 MG/ML  SOLN
25.0000 mL | Freq: Once | INTRAMUSCULAR | Status: AC | PRN
  Administered 2015-01-27: 25 mL via ORAL

## 2015-01-27 MED ORDER — KETOROLAC TROMETHAMINE 30 MG/ML IJ SOLN
30.0000 mg | Freq: Once | INTRAMUSCULAR | Status: AC
  Administered 2015-01-27: 30 mg via INTRAVENOUS
  Filled 2015-01-27: qty 1

## 2015-01-27 NOTE — ED Provider Notes (Signed)
CSN: [id number]     Arrival date & time 01/26/15  2354 History   First MD Initiated Contact with Patient 01/27/15 0245     Chief Complaint  Patient presents with  . Abdominal Pain     (Consider location/radiation/quality/duration/timing/severity/associated sxs/prior Treatment) HPI Comments: 24 year old female presents to the emergency department for further evaluation of abdominal pain. Patient reports pain in her right lower quadrant and suprapubic abdomen, constant over the past 3 days. Also reports the patient has had similar pain over the past 1.5 years; however, the patient has only ever lasted a few hours before spontaneously resolving. Pain has been associated with some dysuria and generalized weakness. Patient has not taken any medications prior to arrival. She has not had any associated fever, nausea, vomiting, hematuria, or vaginal bleeding. No history of abdominal surgeries.  Patient is a 24 y.o. female presenting with abdominal pain. The history is provided by the patient and the spouse. No language interpreter was used.  Abdominal Pain Associated symptoms: dysuria and fatigue   Associated symptoms: no diarrhea, no nausea and no vomiting     Past Medical History  Diagnosis Date  . Medical history non-contributory    History reviewed. No pertinent past surgical history. No family history on file. History  Substance Use Topics  . Smoking status: Never Smoker   . Smokeless tobacco: Not on file  . Alcohol Use: No   OB History    Gravida Para Term Preterm AB TAB SAB Ectopic Multiple Living   2 2 1 1      2       Review of Systems  Constitutional: Positive for fatigue.  Gastrointestinal: Positive for abdominal pain. Negative for nausea, vomiting and diarrhea.  Genitourinary: Positive for dysuria.  Neurological: Positive for weakness (generalized).  All other systems reviewed and are negative.   Allergies  Review of patient's allergies indicates no known  allergies.  Home Medications   Prior to Admission medications   Medication Sig Start Date End Date Taking? Authorizing Provider  naproxen sodium (ANAPROX) 220 MG tablet Take 440 mg by mouth daily as needed (pain).   Yes Historical Provider, MD   BP 92/57 mmHg  Pulse 67  Temp(Src) 98 F (36.7 C) (Oral)  Resp 18  Ht 5\' 5"  (1.651 m)  Wt 180 lb (81.647 kg)  BMI 29.95 kg/m2  SpO2 97%  LMP 01/13/2015  Breastfeeding? No   Physical Exam  Constitutional: She is oriented to person, place, and time. She appears well-developed and well-nourished. No distress.  Fatigued appearing.  HENT:  Head: Normocephalic and atraumatic.  Eyes: Conjunctivae and EOM are normal. No scleral icterus.  Neck: Normal range of motion.  Cardiovascular: Normal rate, regular rhythm and intact distal pulses.   Pulmonary/Chest: Effort normal. No respiratory distress.  Respirations even and unlabored  Abdominal: Soft. She exhibits no distension. There is tenderness. There is no rebound and no guarding.  Mild TTP in the RUQ; negative Murphy's sign. TTP also noted in the R mid abdomen and RLQ. No peritoneal signs or masses. No guarding.  Musculoskeletal: Normal range of motion.  Neurological: She is alert and oriented to person, place, and time. She exhibits normal muscle tone. Coordination normal.  GCS 15. Patient ambulatory with steady gait.  Skin: Skin is warm and dry. No rash noted. She is not diaphoretic. No erythema. No pallor.  Psychiatric: She has a normal mood and affect. Her behavior is normal.  Nursing note and vitals reviewed.   ED Course  Procedures (  including critical care time) Labs Review Labs Reviewed  CBC WITH DIFFERENTIAL/PLATELET - Abnormal; Notable for the following:    WBC 11.2 (*)    MCH 25.3 (*)    Eosinophils Relative 9 (*)    Eosinophils Absolute 1.0 (*)    All other components within normal limits  COMPREHENSIVE METABOLIC PANEL - Abnormal; Notable for the following:    Glucose, Bld  113 (*)    All other components within normal limits  URINALYSIS, ROUTINE W REFLEX MICROSCOPIC (NOT AT [facility]) - Abnormal; Notable for the following:    APPearance TURBID (*)    Hgb urine dipstick LARGE (*)    Protein, ur 100 (*)    Nitrite POSITIVE (*)    Leukocytes, UA LARGE (*)    All other components within normal limits  URINE MICROSCOPIC-ADD ON - Abnormal; Notable for the following:    Bacteria, UA MANY (*)    Crystals CA OXALATE CRYSTALS (*)    All other components within normal limits  URINE CULTURE  LIPASE, BLOOD  I-STAT TROPOININ, ED  I-STAT BETA HCG BLOOD, ED ([facility], [facility], [facility] ONLY)  POC URINE PREG, ED    Imaging Review Ct Abdomen Pelvis W Contrast  01/27/2015   CLINICAL DATA:  Right lower quadrant pain  EXAM: CT ABDOMEN AND PELVIS WITH CONTRAST  TECHNIQUE: Multidetector CT imaging of the abdomen and pelvis was performed using the standard protocol following bolus administration of intravenous contrast.  CONTRAST:  159mL OMNIPAQUE IOHEXOL 300 MG/ML  SOLN  COMPARISON:  None.  FINDINGS: BODY WALL: No contributory findings.  LOWER CHEST: No contributory findings.  ABDOMEN/PELVIS:  Liver: Dystrophic calcifications in the right liver from remote inflammation/ infection. Patchy hepatic steatosis, most dense in the right liver.  Biliary: No evidence of biliary obstruction or stone.  Pancreas: Unremarkable.  Spleen: Unremarkable.  Adrenals: Unremarkable.  Kidneys and ureters: Diffuse right urothelial thickening of the ureter and pelvis. No stone. No indication of pyelonephritis. No hydronephrosis.  Bladder: Prominent bladder wall thickness, although under distended.  Reproductive: The uterine horns appears septated/divergent, but patient has had successful pregnancy based on previous studies.  Bowel: No obstruction. No appendicitis.  Retroperitoneum: No mass or adenopathy.  Peritoneum: No ascites or pneumoperitoneum.  Vascular: Duplicated IVC.  OSSEOUS: No acute abnormalities.  IMPRESSION: 1. Right  ureteral inflammation which likely reflects ascending infection. Recently passed stone could also give this appearance. No hydronephrosis. 2. Hepatic steatosis and other incidental findings are noted above.   Electronically Signed   By: Monte Fantasia M.D.   On: 01/27/2015 05:08     EKG Interpretation None      MDM   Final diagnoses:  Pyelonephritis    24 year old female presents to the emergency department for further evaluation of right lower quadrant pain x 3 days with dysuria. Workup today consistent with urinary tract infection and, likely, early pyelonephritis. Leukocytosis of 11.2 c/w with this diagnosis. Kidney function is preserved. No evidence of appendicitis on CT scan.   Patient is afebrile and hemodynamically stable. She has no complaints of pain after treatment with Toradol. Patient also given IV Rocephin and IVF prior to discharge. Urine culture is pending. Patient stable for discharge with prescription for ibuprofen and Keflex. Return precautions discussed and provided. Patient discharged in good condition with no unaddressed concerns.   Filed Vitals:   01/27/15 0315 01/27/15 0345 01/27/15 0400 01/27/15 0430  BP: 107/57 107/72 98/60 92/57   Pulse: 78 71 76 67  Temp:  TempSrc:      Resp:  18    Height:      Weight:      SpO2: 97% 97% 98% 97%     Antonietta Breach, PA-C 01/27/15 8127  Varney Biles, MD 01/28/15 [phone]

## 2015-01-27 NOTE — Discharge Instructions (Signed)
You have an infection in your urine (pee) which is why it hurts when you use the bathroom. The infection is moving up to your right kidney which is why you are having pain on the right side of your abdomen (belly). Take Keflex as prescribed to treat your infection. Take ibuprofen for pain. Follow up with your doctor to make sure your pain is getting better and the infection is going away. Come back to the ED if your pain gets worse or if you start to have a fever.  Pyelonephritis, Adult Pyelonephritis is a kidney infection. In general, there are 2 main types of pyelonephritis:  Infections that come on quickly without any warning (acute pyelonephritis).  Infections that persist for a long period of time (chronic pyelonephritis). CAUSES  Two main causes of pyelonephritis are:  Bacteria traveling from the bladder to the kidney. This is a problem especially in pregnant women. The urine in the bladder can become filled with bacteria from multiple causes, including:  Inflammation of the prostate gland (prostatitis).  Sexual intercourse in females.  Bladder infection (cystitis).  Bacteria traveling from the bloodstream to the tissue part of the kidney. Problems that may increase your risk of getting a kidney infection include:  Diabetes.  Kidney stones or bladder stones.  Cancer.  Catheters placed in the bladder.  Other abnormalities of the kidney or ureter. SYMPTOMS   Abdominal pain.  Pain in the side or flank area.  Fever.  Chills.  Upset stomach.  Blood in the urine (dark urine).  Frequent urination.  Strong or persistent urge to urinate.  Burning or stinging when urinating. DIAGNOSIS  Your caregiver may diagnose your kidney infection based on your symptoms. A urine sample may also be taken. TREATMENT  In general, treatment depends on how severe the infection is.   If the infection is mild and caught early, your caregiver may treat you with oral antibiotics and send  you home.  If the infection is more severe, the bacteria may have gotten into the bloodstream. This will require intravenous (IV) antibiotics and a hospital stay. Symptoms may include:  High fever.  Severe flank pain.  Shaking chills.  Even after a hospital stay, your caregiver may require you to be on oral antibiotics for a period of time.  Other treatments may be required depending upon the cause of the infection. HOME CARE INSTRUCTIONS   Take your antibiotics as directed. Finish them even if you start to feel better.  Make an appointment to have your urine checked to make sure the infection is gone.  Drink enough fluids to keep your urine clear or pale yellow.  Take medicines for the bladder if you have urgency and frequency of urination as directed by your caregiver. SEEK IMMEDIATE MEDICAL CARE IF:   You have a fever or persistent symptoms for more than 2-3 days.  You have a fever and your symptoms suddenly get worse.  You are unable to take your antibiotics or fluids.  You develop shaking chills.  You experience extreme weakness or fainting.  There is no improvement after 2 days of treatment. MAKE SURE YOU:  Understand these instructions.  Will watch your condition.  Will get help right away if you are not doing well or get worse. Document Released: 07/08/2005 Document Revised: 01/07/2012 Document Reviewed: 12/12/2010 [facility] Patient Information 2015 [place], [place]. This information is not intended to replace advice given to you by your health care provider. Make sure you discuss any questions you have with  your health care provider.

## 2015-01-27 NOTE — ED Notes (Signed)
Pt reports RLQ abdominal pain, started a year ago after she gave birth. Denies N/V/D.

## 2015-01-30 LAB — URINE CULTURE

## 2015-01-31 ENCOUNTER — Telehealth (HOSPITAL_COMMUNITY): Payer: Self-pay

## 2015-01-31 NOTE — Telephone Encounter (Signed)
Post ED Visit - Positive Culture Follow-up  Culture report reviewed by antimicrobial stewardship pharmacist: []  Wes Dulaney, Pharm.D., BCPS [x]  Heide Guile, Pharm.D., BCPS []  Alycia Rossetti, Pharm.D., BCPS []  [place], Pharm.D., BCPS, AAHIVP []  Legrand Como, Pharm.D., BCPS, AAHIVP []  Isac Sarna, Pharm.D., BCPS  Positive urine culture Treated with cephalexin , organism sensitive to the same and no further patient follow-up is required at this time.  Ileene Musa 01/31/2015, 12:58 PM

## 2015-04-23 ENCOUNTER — Encounter (HOSPITAL_COMMUNITY): Payer: Self-pay | Admitting: Emergency Medicine

## 2015-04-23 ENCOUNTER — Emergency Department (HOSPITAL_COMMUNITY): Payer: Self-pay

## 2015-04-23 ENCOUNTER — Emergency Department (HOSPITAL_COMMUNITY)
Admission: EM | Admit: 2015-04-23 | Discharge: 2015-04-23 | Disposition: A | Discharge status: Home / Self Care | Payer: Self-pay | Attending: Emergency Medicine | Admitting: Emergency Medicine

## 2015-04-23 DIAGNOSIS — N83201 Unspecified ovarian cyst, right side: Secondary | ICD-10-CM | POA: Insufficient documentation

## 2015-04-23 DIAGNOSIS — R1031 Right lower quadrant pain: Secondary | ICD-10-CM

## 2015-04-23 DIAGNOSIS — Z3202 Encounter for pregnancy test, result negative: Secondary | ICD-10-CM | POA: Insufficient documentation

## 2015-04-23 LAB — URINALYSIS, ROUTINE W REFLEX MICROSCOPIC
BILIRUBIN URINE: NEGATIVE
Glucose, UA: NEGATIVE mg/dL
KETONES UR: NEGATIVE mg/dL
Nitrite: NEGATIVE
PH: 7.5 (ref 5.0–8.0)
Protein, ur: NEGATIVE mg/dL
Specific Gravity, Urine: 1.009 (ref 1.005–1.030)
Urobilinogen, UA: 0.2 mg/dL (ref 0.0–1.0)

## 2015-04-23 LAB — CBC WITH DIFFERENTIAL/PLATELET
Basophils Absolute: 0 10*3/uL (ref 0.0–0.1)
Basophils Relative: 1 %
EOS PCT: 9 %
Eosinophils Absolute: 0.6 10*3/uL (ref 0.0–0.7)
HCT: 41.3 % (ref 36.0–46.0)
Hemoglobin: 12.9 g/dL (ref 12.0–15.0)
LYMPHS PCT: 30 %
Lymphs Abs: 1.8 10*3/uL (ref 0.7–4.0)
MCH: 25.6 pg — AB (ref 26.0–34.0)
MCHC: 31.2 g/dL (ref 30.0–36.0)
MCV: 81.9 fL (ref 78.0–100.0)
MONO ABS: 0.6 10*3/uL (ref 0.1–1.0)
Monocytes Relative: 9 %
Neutro Abs: 3.2 10*3/uL (ref 1.7–7.7)
Neutrophils Relative %: 51 %
PLATELETS: 325 10*3/uL (ref 150–400)
RBC: 5.04 MIL/uL (ref 3.87–5.11)
RDW: 13.5 % (ref 11.5–15.5)
WBC: 6.2 10*3/uL (ref 4.0–10.5)

## 2015-04-23 LAB — COMPREHENSIVE METABOLIC PANEL
ALT: 23 U/L (ref 14–54)
AST: 19 U/L (ref 15–41)
Albumin: 3.6 g/dL (ref 3.5–5.0)
Alkaline Phosphatase: 68 U/L (ref 38–126)
Anion gap: 6 (ref 5–15)
BUN: 8 mg/dL (ref 6–20)
CO2: 28 mmol/L (ref 22–32)
CREATININE: 0.67 mg/dL (ref 0.44–1.00)
Calcium: 9.3 mg/dL (ref 8.9–10.3)
Chloride: 107 mmol/L (ref 101–111)
GFR calc Af Amer: >60 mL/min (ref >60)
Glucose, Bld: 94 mg/dL (ref 65–99)
Potassium: 3.7 mmol/L (ref 3.5–5.1)
SODIUM: 141 mmol/L (ref 135–145)
Total Bilirubin: 0.5 mg/dL (ref 0.3–1.2)
Total Protein: 7.4 g/dL (ref 6.5–8.1)

## 2015-04-23 LAB — URINE MICROSCOPIC-ADD ON

## 2015-04-23 LAB — I-STAT BETA HCG BLOOD, ED (MC, WL, AP ONLY): I-stat hCG, quantitative: <5 m[IU]/mL (ref <5)

## 2015-04-23 LAB — LIPASE, BLOOD: Lipase: 29 U/L (ref 22–51)

## 2015-04-23 LAB — PREGNANCY, URINE: Preg Test, Ur: NEGATIVE

## 2015-04-23 MED ORDER — MORPHINE SULFATE (PF) 2 MG/ML IV SOLN
2.0000 mg | Freq: Once | INTRAVENOUS | Status: AC
Start: 2015-04-23 — End: 2015-04-23
  Administered 2015-04-23: 2 mg via INTRAVENOUS
  Filled 2015-04-23: qty 1

## 2015-04-23 MED ORDER — MORPHINE SULFATE (PF) 4 MG/ML IV SOLN
4.0000 mg | Freq: Once | INTRAVENOUS | Status: AC
  Administered 2015-04-23: 4 mg via INTRAVENOUS
  Filled 2015-04-23: qty 1

## 2015-04-23 MED ORDER — NAPROXEN 250 MG PO TABS: 250.0000 mg | ORAL_TABLET | Freq: Two times a day (BID) | ORAL | Status: DC

## 2015-04-23 MED ORDER — SODIUM CHLORIDE 0.9 % IV BOLUS (SEPSIS)
1000.0000 mL | Freq: Once | INTRAVENOUS | Status: AC
  Administered 2015-04-23: 1000 mL via INTRAVENOUS

## 2015-04-23 NOTE — ED Notes (Signed)
Patient transported to Ultrasound 

## 2015-04-23 NOTE — ED Provider Notes (Signed)
CSN: [id number]     Arrival date & time 04/23/15  1120 History   First MD Initiated Contact with Patient 04/23/15 1131     Chief Complaint  Patient presents with  . Abdominal Pain   Stephanie Holmes is a 24 y.o. female who is from [place] and speaks [place] who presets to the ED with her husband complaining of sudden onset of right lower quadrant abdominal pain which radiates to her back since yesterday. She reports that this pain was sudden onset yesterday and has been constant since. She denies any nausea, vomiting or diarrhea. She reports her pain can be worse with movement. She reports she is currently on her menstrual cycle. She denies increased vaginal bleeding or discharge. The patient speaks a language that was hard to obtain an interpreter for. Was unable to obtain an interpreter on the language line. The history is somewhat limited because of this. Luckily, the husband of the patient was able to translate enough for this history. The patient denied fevers, nausea, vomiting, diarrhea, dysuria, hematuria, or hematochezia. She denies previous abdominal surgeries.    (Consider location/radiation/quality/duration/timing/severity/associated sxs/prior Treatment) The history is provided by the patient and the spouse. The history is limited by a language barrier.    Past Medical History  Diagnosis Date  . Medical history non-contributory    History reviewed. No pertinent past surgical history. History reviewed. No pertinent family history. Social History  Substance Use Topics  . Smoking status: Never Smoker   . Smokeless tobacco: None  . Alcohol Use: No   OB History    Gravida Para Term Preterm AB TAB SAB Ectopic Multiple Living   2 2 1 1      2      Review of Systems  Constitutional: Negative for fever and chills.  Respiratory: Negative for shortness of breath.   Gastrointestinal: Positive for abdominal pain. Negative for nausea, vomiting, diarrhea, constipation and blood in stool.   Genitourinary: Negative for dysuria, frequency, hematuria, vaginal bleeding and vaginal discharge.  Musculoskeletal: Positive for back pain. Negative for neck pain.  Skin: Negative for rash.      Allergies  Review of patient's allergies indicates no known allergies.  Home Medications   Prior to Admission medications   Medication Sig Start Date End Date Taking? Authorizing Provider  naproxen (NAPROSYN) 250 MG tablet Take 1 tablet (250 mg total) by mouth 2 (two) times daily with a meal. 04/23/15   Waynetta Pean, PA-C   BP 101/55 mmHg  Pulse 70  Temp(Src) 97.9 F (36.6 C) (Oral)  Resp 16  Ht 5\' 6"  (1.676 m)  Wt 203 lb (92.08 kg)  BMI 32.78 kg/m2  SpO2 99%  LMP  Physical Exam  Constitutional: She is oriented to person, place, and time. She appears well-developed and well-nourished. No distress.  Nontoxic appearing.  HENT:  Head: Normocephalic and atraumatic.  Mouth/Throat: Oropharynx is clear and moist.  Eyes: Conjunctivae are normal. Pupils are equal, round, and reactive to light. Right eye exhibits no discharge. Left eye exhibits no discharge.  Neck: Neck supple.  Cardiovascular: Normal rate, regular rhythm, normal heart sounds and intact distal pulses.  Exam reveals no gallop and no friction rub.   No murmur heard. Pulmonary/Chest: Effort normal and breath sounds normal. No respiratory distress. She has no wheezes. She has no rales.  Abdominal: Soft. Bowel sounds are normal. She exhibits no distension and no mass. There is tenderness. There is no rebound.  Abdomen is soft. Bowel sounds are present. Patient has  right lower quadrant tenderness to palpation. No rebound tenderness. No Rovsing sign.   Musculoskeletal: She exhibits no edema or tenderness.  Lymphadenopathy:    She has no cervical adenopathy.  Neurological: She is alert and oriented to person, place, and time. Coordination normal.  Skin: Skin is warm and dry. No rash noted. She is not diaphoretic. No erythema. No  pallor.  Psychiatric: She has a normal mood and affect. Her behavior is normal.  Nursing note and vitals reviewed.   ED Course  Procedures (including critical care time) Labs Review Labs Reviewed  CBC WITH DIFFERENTIAL/PLATELET - Abnormal; Notable for the following:    MCH 25.6 (*)    All other components within normal limits  URINALYSIS, ROUTINE W REFLEX MICROSCOPIC (NOT AT [facility]) - Abnormal; Notable for the following:    Hgb urine dipstick LARGE (*)    Leukocytes, UA SMALL (*)    All other components within normal limits  URINE MICROSCOPIC-ADD ON - Abnormal; Notable for the following:    Squamous Epithelial / LPF MANY (*)    All other components within normal limits  URINE CULTURE  COMPREHENSIVE METABOLIC PANEL  LIPASE, BLOOD  PREGNANCY, URINE  I-STAT BETA HCG BLOOD, ED ([facility], [facility], [facility] ONLY)    Imaging Review US Transvaginal Non-ob  04/23/2015   CLINICAL DATA:  Right lower quadrant pain.  Sudden onset.  EXAM: TRANSABDOMINAL AND TRANSVAGINAL ULTRASOUND OF PELVIS  DOPPLER ULTRASOUND OF OVARIES  TECHNIQUE: Both transabdominal and transvaginal ultrasound examinations of the pelvis were performed. Transabdominal technique was performed for global imaging of the pelvis including uterus, ovaries, adnexal regions, and pelvic cul-de-sac.  It was necessary to proceed with endovaginal exam following the transabdominal exam to visualize the endometrium and ovaries. Color and duplex Doppler ultrasound was utilized to evaluate blood flow to the ovaries.  COMPARISON:  None.  FINDINGS: Uterus  Measurements: 7.1 x 3.9 x 6.4 cm. 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 No fibroids or other mass visualized.  Endometrium  Thickness: 4.7 mm.  No focal abnormality visualized.  Right ovary  Measurements: 5.7 x 4.2 x 4 cm. Anechoic 4.9 x 3.7 x 3.5 cm right ovarian mass most consistent with a cyst.  Left ovary  Measurements: 3 x 1.5 x 2.5 cm. Normal appearance/no adnexal mass.  Pulsed Doppler evaluation of both  ovaries demonstrates normal low-resistance arterial and venous waveforms.  Other findings  No free fluid.  IMPRESSION: 1. No ovarian torsion. 2. Right ovarian cyst.   Electronically Signed   By: Kathreen Devoid   On: 04/23/2015 14:31   US Pelvis Complete  04/23/2015   CLINICAL DATA:  Right lower quadrant pain.  Sudden onset.  EXAM: TRANSABDOMINAL AND TRANSVAGINAL ULTRASOUND OF PELVIS  DOPPLER ULTRASOUND OF OVARIES  TECHNIQUE: Both transabdominal and transvaginal ultrasound examinations of the pelvis were performed. Transabdominal technique was performed for global imaging of the pelvis including uterus, ovaries, adnexal regions, and pelvic cul-de-sac.  It was necessary to proceed with endovaginal exam following the transabdominal exam to visualize the endometrium and ovaries. Color and duplex Doppler ultrasound was utilized to evaluate blood flow to the ovaries.  COMPARISON:  None.  FINDINGS: Uterus  Measurements: 7.1 x 3.9 x 6.4 cm. 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 No fibroids or other mass visualized.  Endometrium  Thickness: 4.7 mm.  No focal abnormality visualized.  Right ovary  Measurements: 5.7 x 4.2 x 4 cm. Anechoic 4.9 x 3.7 x 3.5 cm right ovarian mass most consistent with a cyst.  Left ovary  Measurements: 3 x 1.5 x 2.5 cm. Normal appearance/no adnexal mass.  Pulsed Doppler evaluation of both ovaries demonstrates normal low-resistance arterial and venous waveforms.  Other findings  No free fluid.  IMPRESSION: 1. No ovarian torsion. 2. Right ovarian cyst.   Electronically Signed   By: Kathreen Devoid   On: 04/23/2015 14:31   [place] Art/ven Flow Abd Pelv Doppler  04/23/2015   CLINICAL DATA:  Right lower quadrant pain.  Sudden onset.  EXAM: TRANSABDOMINAL AND TRANSVAGINAL ULTRASOUND OF PELVIS  DOPPLER ULTRASOUND OF OVARIES  TECHNIQUE: Both transabdominal and transvaginal ultrasound examinations of the pelvis were performed. Transabdominal technique was performed for global imaging of the pelvis  including uterus, ovaries, adnexal regions, and pelvic cul-de-sac.  It was necessary to proceed with endovaginal exam following the transabdominal exam to visualize the endometrium and ovaries. Color and duplex Doppler ultrasound was utilized to evaluate blood flow to the ovaries.  COMPARISON:  None.  FINDINGS: Uterus  Measurements: 7.1 x 3.9 x 6.4 cm. 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 No fibroids or other mass visualized.  Endometrium  Thickness: 4.7 mm.  No focal abnormality visualized.  Right ovary  Measurements: 5.7 x 4.2 x 4 cm. Anechoic 4.9 x 3.7 x 3.5 cm right ovarian mass most consistent with a cyst.  Left ovary  Measurements: 3 x 1.5 x 2.5 cm. Normal appearance/no adnexal mass.  Pulsed Doppler evaluation of both ovaries demonstrates normal low-resistance arterial and venous waveforms.  Other findings  No free fluid.  IMPRESSION: 1. No ovarian torsion. 2. Right ovarian cyst.   Electronically Signed   By: Kathreen Devoid   On: 04/23/2015 14:31   I have personally reviewed and evaluated these images and lab results as part of my medical decision-making.   EKG Interpretation None      Filed Vitals:   04/23/15 1145 04/23/15 1147 04/23/15 1149 04/23/15 1245  BP: 102/44 102/44  101/55  Pulse: 86 85  70  Temp:  97.9 F (36.6 C)    TempSrc:  Oral    Resp:  16    Height:   5\' 6"  (1.676 m)   Weight:   203 lb (92.08 kg)   SpO2: 99% 99%  99%     MDM   Meds given in ED:  Medications  morphine 2 MG/ML injection 2 mg (not administered)  sodium chloride 0.9 % bolus 1,000 mL (1,000 mLs Intravenous New Bag/Given 04/23/15 1405)  morphine 4 MG/ML injection 4 mg (4 mg Intravenous Given 04/23/15 1308)    New Prescriptions   NAPROXEN (NAPROSYN) 250 MG TABLET    Take 1 tablet (250 mg total) by mouth 2 (two) times daily with a meal.    Final diagnoses:  Cyst of right ovary    This is a 24 y.o. female who is from [place] and speaks [place] who presets to the ED with her husband  complaining of sudden onset of right lower quadrant abdominal pain which radiates to her back since yesterday. She reports that this pain was sudden onset yesterday and has been constant since. She denies any nausea, vomiting or diarrhea. She reports her pain can be worse with movement. She reports she is currently on her menstrual cycle. On exam the patient is afebrile and nontoxic appearing. Patient's abdomen is soft and she has right lower quadrant abdominal tenderness to palpation. No rebound tenderness. No Rovsing sign. Patient reports sudden onset of pain and I am concerned for ovarian torsion. Will obtain lab  work and pelvic ultrasound to rule out ovarian torsion. Patient is a negative urine pregnancy test. Urinalysis shows small leukocytes with many squamous epithelial cells and 21-50 white blood cells. Urine sent for culture. CMP is within normal limits. Lipase is within normal limits. CBC is unremarkable and shows no leukocytosis. Pelvic ultrasound revealed a right ovarian cyst. No ovarian torsion.  At reevaluation the patient reports feeling much better. She reports she is hungry. She reports she feels ready for discharge. The patient has tolerated ginger ale and crackers prior to discharge. She denies any nausea or vomiting.  We'll discharge a prescription for naproxen and have her follow-up with the women's outpatient clinic this week. I discussed return precautions. I advised the patient to follow-up with their primary care provider this week. I advised the patient to return to the emergency department with new or worsening symptoms or new concerns. The patient and her sister who speaks english very well verbalized understanding and agreement with plan.    This patient was discussed with Dr. Sabra Heck who agrees with assessment and plan.   Waynetta Pean, PA-C 04/23/15 1511  Noemi Chapel, MD 04/25/15 [phone]

## 2015-04-23 NOTE — ED Notes (Addendum)
Pt sts RLQ pain into back x 2 days; pt sts LMP was 930

## 2015-04-23 NOTE — Discharge Instructions (Signed)
Ovarian Cyst An ovarian cyst is a fluid-filled sac that forms on an ovary. The ovaries are small organs that produce eggs in women. Various types of cysts can form on the ovaries. Most are not cancerous. Many do not cause problems, and they often go away on their own. Some may cause symptoms and require treatment. Common types of ovarian cysts include:  Functional cysts--These cysts may occur every month during the menstrual cycle. This is normal. The cysts usually go away with the next menstrual cycle if the woman does not get pregnant. Usually, there are no symptoms with a functional cyst.  Endometrioma cysts--These cysts form from the tissue that lines the uterus. They are also called "chocolate cysts" because they become filled with blood that turns brown. This type of cyst can cause pain in the lower abdomen during intercourse and with your menstrual period.  Cystadenoma cysts--This type develops from the cells on the outside of the ovary. These cysts can get very big and cause lower abdomen pain and pain with intercourse. This type of cyst can twist on itself, cut off its blood supply, and cause severe pain. It can also easily rupture and cause a lot of pain.  Dermoid cysts--This type of cyst is sometimes found in both ovaries. These cysts may contain different kinds of body tissue, such as skin, teeth, hair, or cartilage. They usually do not cause symptoms unless they get very big.  Theca lutein cysts--These cysts occur when too much of a certain hormone (human chorionic gonadotropin) is produced and overstimulates the ovaries to produce an egg. This is most common after procedures used to assist with the conception of a baby (in vitro fertilization). CAUSES   Fertility drugs can cause a condition in which multiple large cysts are formed on the ovaries. This is called ovarian hyperstimulation syndrome.  A condition called polycystic ovary syndrome can cause hormonal imbalances that can lead to  nonfunctional ovarian cysts. SIGNS AND SYMPTOMS  Many ovarian cysts do not cause symptoms. If symptoms are present, they may include:  Pelvic pain or pressure.  Pain in the lower abdomen.  Pain during sexual intercourse.  Increasing girth (swelling) of the abdomen.  Abnormal menstrual periods.  Increasing pain with menstrual periods.  Stopping having menstrual periods without being pregnant. DIAGNOSIS  These cysts are commonly found during a routine or annual pelvic exam. Tests may be ordered to find out more about the cyst. These tests may include:  Ultrasound.  X-ray of the pelvis.  CT scan.  MRI.  Blood tests. TREATMENT  Many ovarian cysts go away on their own without treatment. Your health care provider may want to check your cyst regularly for 2-3 months to see if it changes. For women in menopause, it is particularly important to monitor a cyst closely because of the higher rate of ovarian cancer in menopausal women. When treatment is needed, it may include any of the following:  A procedure to drain the cyst (aspiration). This may be done using a long needle and ultrasound. It can also be done through a laparoscopic procedure. This involves using a thin, lighted tube with a tiny camera on the end (laparoscope) inserted through a small incision.  Surgery to remove the whole cyst. This may be done using laparoscopic surgery or an open surgery involving a larger incision in the lower abdomen.  Hormone treatment or birth control pills. These methods are sometimes used to help dissolve a cyst. HOME CARE INSTRUCTIONS   Only take over-the-counter   or prescription medicines as directed by your health care provider.  Follow up with your health care provider as directed.  Get regular pelvic exams and Pap tests. SEEK MEDICAL CARE IF:   Your periods are late, irregular, or painful, or they stop.  Your pelvic pain or abdominal pain does not go away.  Your abdomen becomes  larger or swollen.  You have pressure on your bladder or trouble emptying your bladder completely.  You have pain during sexual intercourse.  You have feelings of fullness, pressure, or discomfort in your stomach.  You lose weight for no apparent reason.  You feel generally ill.  You become constipated.  You lose your appetite.  You develop acne.  You have an increase in body and facial hair.  You are gaining weight, without changing your exercise and eating habits.  You think you are pregnant. SEEK IMMEDIATE MEDICAL CARE IF:   You have increasing abdominal pain.  You feel sick to your stomach (nauseous), and you throw up (vomit).  You develop a fever that comes on suddenly.  You have abdominal pain during a bowel movement.  Your menstrual periods become heavier than usual. MAKE SURE YOU:  Understand these instructions.  Will watch your condition.  Will get help right away if you are not doing well or get worse. Document Released: 07/08/2005 Document Revised: 07/13/2013 Document Reviewed: 03/15/2013 Exit[employer] Patient Information 2015 [employer]. This information is not intended to replace advice given to you by your health care provider. Make sure you discuss any questions you have with your health care provider.  

## 2015-04-23 NOTE — ED Notes (Signed)
Pt native language [place], unable to obtain interpreter.  Husband speaks English well, has been working with [place] to communicate with pt.  Pt does understand and speak some English burt is more comfortable with husband speaking for her.

## 2015-04-24 LAB — URINE CULTURE

## 2015-05-04 ENCOUNTER — Emergency Department (HOSPITAL_COMMUNITY)
Admission: EM | Admit: 2015-05-04 | Discharge: 2015-05-04 | Disposition: A | Discharge status: Home / Self Care | Payer: Self-pay | Attending: Emergency Medicine | Admitting: Emergency Medicine

## 2015-05-04 ENCOUNTER — Emergency Department (HOSPITAL_COMMUNITY): Payer: Self-pay

## 2015-05-04 ENCOUNTER — Encounter (HOSPITAL_COMMUNITY): Payer: Self-pay | Admitting: Emergency Medicine

## 2015-05-04 DIAGNOSIS — R109 Unspecified abdominal pain: Secondary | ICD-10-CM

## 2015-05-04 DIAGNOSIS — R1011 Right upper quadrant pain: Secondary | ICD-10-CM | POA: Insufficient documentation

## 2015-05-04 DIAGNOSIS — Z791 Long term (current) use of non-steroidal anti-inflammatories (NSAID): Secondary | ICD-10-CM | POA: Insufficient documentation

## 2015-05-04 DIAGNOSIS — Z3202 Encounter for pregnancy test, result negative: Secondary | ICD-10-CM | POA: Insufficient documentation

## 2015-05-04 LAB — URINE MICROSCOPIC-ADD ON

## 2015-05-04 LAB — COMPREHENSIVE METABOLIC PANEL
ALT: 31 U/L (ref 14–54)
ANION GAP: 9 (ref 5–15)
AST: 21 U/L (ref 15–41)
Albumin: 3.8 g/dL (ref 3.5–5.0)
Alkaline Phosphatase: 75 U/L (ref 38–126)
BILIRUBIN TOTAL: 0.3 mg/dL (ref 0.3–1.2)
BUN: 8 mg/dL (ref 6–20)
CO2: 26 mmol/L (ref 22–32)
Calcium: 8.9 mg/dL (ref 8.9–10.3)
Chloride: 101 mmol/L (ref 101–111)
Creatinine, Ser: 0.67 mg/dL (ref 0.44–1.00)
GFR calc Af Amer: >60 mL/min (ref >60)
Glucose, Bld: 105 mg/dL — ABNORMAL HIGH (ref 65–99)
POTASSIUM: 3.8 mmol/L (ref 3.5–5.1)
Sodium: 136 mmol/L (ref 135–145)
TOTAL PROTEIN: 7.8 g/dL (ref 6.5–8.1)

## 2015-05-04 LAB — URINALYSIS, ROUTINE W REFLEX MICROSCOPIC
Bilirubin Urine: NEGATIVE
Glucose, UA: NEGATIVE mg/dL
KETONES UR: NEGATIVE mg/dL
NITRITE: NEGATIVE
PH: 5.5 (ref 5.0–8.0)
PROTEIN: NEGATIVE mg/dL
Specific Gravity, Urine: 1.015 (ref 1.005–1.030)
UROBILINOGEN UA: 0.2 mg/dL (ref 0.0–1.0)

## 2015-05-04 LAB — CBC
HEMATOCRIT: 42.3 % (ref 36.0–46.0)
HEMOGLOBIN: 13.3 g/dL (ref 12.0–15.0)
MCH: 25.5 pg — ABNORMAL LOW (ref 26.0–34.0)
MCHC: 31.4 g/dL (ref 30.0–36.0)
MCV: 81.2 fL (ref 78.0–100.0)
Platelets: 428 10*3/uL — ABNORMAL HIGH (ref 150–400)
RBC: 5.21 MIL/uL — ABNORMAL HIGH (ref 3.87–5.11)
RDW: 13.3 % (ref 11.5–15.5)
WBC: 8.8 10*3/uL (ref 4.0–10.5)

## 2015-05-04 LAB — I-STAT BETA HCG BLOOD, ED (MC, WL, AP ONLY): I-stat hCG, quantitative: <5 m[IU]/mL (ref <5)

## 2015-05-04 MED ORDER — HYDROCODONE-ACETAMINOPHEN 5-325 MG PO TABS
2.0000 | ORAL_TABLET | Freq: Once | ORAL | Status: AC
  Administered 2015-05-04: 2 via ORAL
  Filled 2015-05-04: qty 2

## 2015-05-04 NOTE — ED Notes (Signed)
Patient returned from US.

## 2015-05-04 NOTE — ED Notes (Signed)
Pt with LLQ pain radiating to her back since yesterday. Denies fever, dysuria. Pts family states seen here previously for same but no improvement in symptoms.

## 2015-05-04 NOTE — Discharge Instructions (Signed)
Continue to take naproxen as directed as needed for pain. Keep your scheduled appointment at the [facility] clinic on 05/15/2015

## 2015-05-04 NOTE — ED Provider Notes (Signed)
CSN: [id number]     Arrival date & time 05/04/15  1350 History   First MD Initiated Contact with Patient 05/04/15 1536     Chief Complaint  Patient presents with  . Abdominal Pain   Level V caveat. Language barrier. I attempted to call [place] language line.No [place] interpreter available. Patient does speak some [place]. I communicated with her and her grandmother by speaking [place].  (Consider location/radiation/quality/duration/timing/severity/associated sxs/prior Treatment) HPI Patient complains of right-sided flank pain radiating to right upper quadrant onset yesterday. No anorexia and no fever. She was treated with naproxen, without relief. No shortness of breath no cough. Pain is worse with changing positions improved with remaining still. No other associated symptoms pain is moderate at present. Last normal menstrual period 04/25/2015. No other associated symptoms. She vehemently denies left-sided abdominal pain or back pain. Past Medical History  Diagnosis Date  . Medical history non-contributory    History reviewed. No pertinent past surgical history. patient was seen in the emergency department 04/23/2015 had pelvic ultrasound , determined to have right ovarian cyst.  History reviewed. No pertinent family history. Social History  Substance Use Topics  . Smoking status: Never Smoker   . Smokeless tobacco: None  . Alcohol Use: No   OB History    Gravida Para Term Preterm AB TAB SAB Ectopic Multiple Living   2 2 1 1      2      Review of Systems  Gastrointestinal: Positive for abdominal pain.  Genitourinary: Positive for flank pain.  All other systems reviewed and are negative.     Allergies  Review of patient's allergies indicates no known allergies.  Home Medications   Prior to Admission medications   Medication Sig Start Date End Date Taking? Authorizing Provider  naproxen (NAPROSYN) 250 MG tablet Take 1 tablet (250 mg total) by mouth 2 (two) times daily  with a meal. 04/23/15   Waynetta Pean, PA-C   BP 119/89 mmHg  Pulse 92  Temp(Src) 97.9 F (36.6 C) (Oral)  Resp 18  SpO2 98%  LMP 04/25/2015 Physical Exam  Constitutional: She appears well-developed and well-nourished.  HENT:  Head: Normocephalic and atraumatic.  Eyes: Conjunctivae are normal. Pupils are equal, round, and reactive to light.  Neck: Neck supple. No tracheal deviation present. No thyromegaly present.  Cardiovascular: Normal rate and regular rhythm.   No murmur heard. Pulmonary/Chest: Effort normal and breath sounds normal.  Abdominal: Soft. Bowel sounds are normal. She exhibits no distension. There is no tenderness.  Obese, right upper quadrant tenderness, no right lower quadrant tenderness  Genitourinary:  Right flank tenderness  Musculoskeletal: Normal range of motion. She exhibits no edema or tenderness.  Neurological: She is alert. Coordination normal.  Skin: Skin is warm and dry. No rash noted.  Psychiatric: She has a normal mood and affect.  Nursing note and vitals reviewed.   ED Course  Procedures (including critical care time) Labs Review Labs Reviewed  COMPREHENSIVE METABOLIC PANEL - Abnormal; Notable for the following:    Glucose, Bld 105 (*)    All other components within normal limits  CBC - Abnormal; Notable for the following:    RBC 5.21 (*)    MCH 25.5 (*)    Platelets 428 (*)    All other components within normal limits  URINALYSIS, ROUTINE W REFLEX MICROSCOPIC (NOT AT [facility])  I-STAT BETA HCG BLOOD, ED ([facility], [facility], [facility] ONLY)    Imaging Review No results found. I have personally reviewed and evaluated these images  and lab results as part of my medical decision-making.   EKG Interpretation None     6:30 PM pain improved after treatment with Norco. Results for orders placed or performed during the hospital encounter of 05/04/15  Comprehensive metabolic panel  Result Value Ref Range   Sodium 136 135 - 145 mmol/L   Potassium 3.8 3.5 - 5.1  mmol/L   Chloride 101 101 - 111 mmol/L   CO2 26 22 - 32 mmol/L   Glucose, Bld 105 (H) 65 - 99 mg/dL   BUN 8 6 - 20 mg/dL   Creatinine, Ser 0.67 0.44 - 1.00 mg/dL   Calcium 8.9 8.9 - 10.3 mg/dL   Total Protein 7.8 6.5 - 8.1 g/dL   Albumin 3.8 3.5 - 5.0 g/dL   AST 21 15 - 41 U/L   ALT 31 14 - 54 U/L   Alkaline Phosphatase 75 38 - 126 U/L   Total Bilirubin 0.3 0.3 - 1.2 mg/dL   GFR calc non Af Amer >60 >60 mL/min   GFR calc Af Amer >60 >60 mL/min   Anion gap 9 5 - 15  CBC  Result Value Ref Range   WBC 8.8 4.0 - 10.5 K/uL   RBC 5.21 (H) 3.87 - 5.11 MIL/uL   Hemoglobin 13.3 12.0 - 15.0 g/dL   HCT 42.3 36.0 - 46.0 %   MCV 81.2 78.0 - 100.0 fL   MCH 25.5 (L) 26.0 - 34.0 pg   MCHC 31.4 30.0 - 36.0 g/dL   RDW 13.3 11.5 - 15.5 %   Platelets 428 (H) 150 - 400 K/uL  Urinalysis, Routine w reflex microscopic (not at [facility])  Result Value Ref Range   Color, Urine YELLOW YELLOW   APPearance CLOUDY (A) CLEAR   Specific Gravity, Urine 1.015 1.005 - 1.030   pH 5.5 5.0 - 8.0   Glucose, UA NEGATIVE NEGATIVE mg/dL   Hgb urine dipstick MODERATE (A) NEGATIVE   Bilirubin Urine NEGATIVE NEGATIVE   Ketones, ur NEGATIVE NEGATIVE mg/dL   Protein, ur NEGATIVE NEGATIVE mg/dL   Urobilinogen, UA 0.2 0.0 - 1.0 mg/dL   Nitrite NEGATIVE NEGATIVE   Leukocytes, UA LARGE (A) NEGATIVE  Urine microscopic-add on  Result Value Ref Range   Squamous Epithelial / LPF MANY (A) RARE   WBC, UA 11-20 <3 WBC/hpf   RBC / HPF 3-6 <3 RBC/hpf   Bacteria, UA MANY (A) RARE   Urine-Other MUCOUS PRESENT   I-Stat beta hCG blood, ED ([facility], [facility], [facility] only)  Result Value Ref Range   I-stat hCG, quantitative <5.0 <5 mIU/mL   Comment 3           US Transvaginal Non-ob  04/23/2015  CLINICAL DATA:  Right lower quadrant pain.  Sudden onset. EXAM: TRANSABDOMINAL AND TRANSVAGINAL ULTRASOUND OF PELVIS DOPPLER ULTRASOUND OF OVARIES TECHNIQUE: Both transabdominal and transvaginal ultrasound examinations of the pelvis were performed.  Transabdominal technique was performed for global imaging of the pelvis including uterus, ovaries, adnexal regions, and pelvic cul-de-sac. It was necessary to proceed with endovaginal exam following the transabdominal exam to visualize the endometrium and ovaries. Color and duplex Doppler ultrasound was utilized to evaluate blood flow to the ovaries. COMPARISON:  None. FINDINGS: Uterus Measurements: 7.1 x 3.9 x 6.4 cm. 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 No fibroids or other mass visualized. Endometrium Thickness: 4.7 mm.  No focal abnormality visualized. Right ovary Measurements: 5.7 x 4.2 x 4 cm. Anechoic 4.9 x 3.7 x 3.5 cm right ovarian mass most  consistent with a cyst. Left ovary Measurements: 3 x 1.5 x 2.5 cm. Normal appearance/no adnexal mass. Pulsed Doppler evaluation of both ovaries demonstrates normal low-resistance arterial and venous waveforms. Other findings No free fluid. IMPRESSION: 1. No ovarian torsion. 2. Right ovarian cyst. Electronically Signed   By: Kathreen Devoid   On: 04/23/2015 14:31   US Pelvis Complete  04/23/2015  CLINICAL DATA:  Right lower quadrant pain.  Sudden onset. EXAM: TRANSABDOMINAL AND TRANSVAGINAL ULTRASOUND OF PELVIS DOPPLER ULTRASOUND OF OVARIES TECHNIQUE: Both transabdominal and transvaginal ultrasound examinations of the pelvis were performed. Transabdominal technique was performed for global imaging of the pelvis including uterus, ovaries, adnexal regions, and pelvic cul-de-sac. It was necessary to proceed with endovaginal exam following the transabdominal exam to visualize the endometrium and ovaries. Color and duplex Doppler ultrasound was utilized to evaluate blood flow to the ovaries. COMPARISON:  None. FINDINGS: Uterus Measurements: 7.1 x 3.9 x 6.4 cm. 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 No fibroids or other mass visualized. Endometrium Thickness: 4.7 mm.  No focal abnormality visualized. Right ovary Measurements: 5.7 x 4.2 x 4 cm. Anechoic 4.9 x 3.7  x 3.5 cm right ovarian mass most consistent with a cyst. Left ovary Measurements: 3 x 1.5 x 2.5 cm. Normal appearance/no adnexal mass. Pulsed Doppler evaluation of both ovaries demonstrates normal low-resistance arterial and venous waveforms. Other findings No free fluid. IMPRESSION: 1. No ovarian torsion. 2. Right ovarian cyst. Electronically Signed   By: Kathreen Devoid   On: 04/23/2015 14:31   US Abdomen Limited  05/04/2015  CLINICAL DATA:  Left lower quadrant abdomen pain EXAM: US ABDOMEN LIMITED - RIGHT UPPER QUADRANT COMPARISON:  None. FINDINGS: Gallbladder: No gallstones or wall thickening visualized. No sonographic Murphy sign noted. Common bile duct: Diameter: 3 mm Liver: No focal lesion identified. Within normal limits in parenchymal echogenicity. IMPRESSION: Normal gallbladder.  No acute abnormality. Electronically Signed   By: Abelardo Diesel M.D.   On: 05/04/2015 18:23   [place] Art/ven Flow Abd Pelv Doppler  04/23/2015  CLINICAL DATA:  Right lower quadrant pain.  Sudden onset. EXAM: TRANSABDOMINAL AND TRANSVAGINAL ULTRASOUND OF PELVIS DOPPLER ULTRASOUND OF OVARIES TECHNIQUE: Both transabdominal and transvaginal ultrasound examinations of the pelvis were performed. Transabdominal technique was performed for global imaging of the pelvis including uterus, ovaries, adnexal regions, and pelvic cul-de-sac. It was necessary to proceed with endovaginal exam following the transabdominal exam to visualize the endometrium and ovaries. Color and duplex Doppler ultrasound was utilized to evaluate blood flow to the ovaries. COMPARISON:  None. FINDINGS: Uterus Measurements: 7.1 x 3.9 x 6.4 cm. 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 No fibroids or other mass visualized. Endometrium Thickness: 4.7 mm.  No focal abnormality visualized. Right ovary Measurements: 5.7 x 4.2 x 4 cm. Anechoic 4.9 x 3.7 x 3.5 cm right ovarian mass most consistent with a cyst. Left ovary Measurements: 3 x 1.5 x 2.5 cm. Normal appearance/no  adnexal mass. Pulsed Doppler evaluation of both ovaries demonstrates normal low-resistance arterial and venous waveforms. Other findings No free fluid. IMPRESSION: 1. No ovarian torsion. 2. Right ovarian cyst. Electronically Signed   By: Kathreen Devoid   On: 04/23/2015 14:31    MDM  Pain is felt to be nonspecific. Patient denies urinary symptoms. Doubt pyelonephritis. Urine contaminated. Doubt acute appendicitis. No right lower quadrant pain or tenderness, patient is hungry at 6:30 PM Final diagnoses:  None   plan continue naproxen as prescribed. Patient has follow-up appointment scheduled for 05/15/2015 at [facility]  clinic Diagnosis #1 nonspecific abdominal pain #2 nonspecific right flank pain      Orlie Dakin, MD 05/04/15 8341

## 2015-05-04 NOTE — ED Notes (Signed)
MD Jacobuwitz at the bedside  

## 2015-05-15 ENCOUNTER — Ambulatory Visit (INDEPENDENT_AMBULATORY_CARE_PROVIDER_SITE_OTHER): Payer: Self-pay | Admitting: Obstetrics & Gynecology

## 2015-05-15 VITALS — BP 115/53 | HR 90 | Wt 202.7 lb

## 2015-05-15 DIAGNOSIS — Z975 Presence of (intrauterine) contraceptive device: Secondary | ICD-10-CM

## 2015-05-15 DIAGNOSIS — N83201 Unspecified ovarian cyst, right side: Secondary | ICD-10-CM

## 2015-05-15 NOTE — Patient Instructions (Signed)
Ovarian Cyst An ovarian cyst is a fluid-filled sac that forms on an ovary. The ovaries are small organs that produce eggs in women. Various types of cysts can form on the ovaries. Most are not cancerous. Many do not cause problems, and they often go away on their own. Some may cause symptoms and require treatment. Common types of ovarian cysts include:  Functional cysts--These cysts may occur every month during the menstrual cycle. This is normal. The cysts usually go away with the next menstrual cycle if the woman does not get pregnant. Usually, there are no symptoms with a functional cyst.  Endometrioma cysts--These cysts form from the tissue that lines the uterus. They are also called "chocolate cysts" because they become filled with blood that turns brown. This type of cyst can cause pain in the lower abdomen during intercourse and with your menstrual period.  Cystadenoma cysts--This type develops from the cells on the outside of the ovary. These cysts can get very big and cause lower abdomen pain and pain with intercourse. This type of cyst can twist on itself, cut off its blood supply, and cause severe pain. It can also easily rupture and cause a lot of pain.  Dermoid cysts--This type of cyst is sometimes found in both ovaries. These cysts may contain different kinds of body tissue, such as skin, teeth, hair, or cartilage. They usually do not cause symptoms unless they get very big.  Theca lutein cysts--These cysts occur when too much of a certain hormone (human chorionic gonadotropin) is produced and overstimulates the ovaries to produce an egg. This is most common after procedures used to assist with the conception of a baby (in vitro fertilization). CAUSES   Fertility drugs can cause a condition in which multiple large cysts are formed on the ovaries. This is called ovarian hyperstimulation syndrome.  A condition called polycystic ovary syndrome can cause hormonal imbalances that can lead to  nonfunctional ovarian cysts. SIGNS AND SYMPTOMS  Many ovarian cysts do not cause symptoms. If symptoms are present, they may include:  Pelvic pain or pressure.  Pain in the lower abdomen.  Pain during sexual intercourse.  Increasing girth (swelling) of the abdomen.  Abnormal menstrual periods.  Increasing pain with menstrual periods.  Stopping having menstrual periods without being pregnant. DIAGNOSIS  These cysts are commonly found during a routine or annual pelvic exam. Tests may be ordered to find out more about the cyst. These tests may include:  Ultrasound.  X-ray of the pelvis.  CT scan.  MRI.  Blood tests. TREATMENT  Many ovarian cysts go away on their own without treatment. Your health care provider may want to check your cyst regularly for 2-3 months to see if it changes. For women in menopause, it is particularly important to monitor a cyst closely because of the higher rate of ovarian cancer in menopausal women. When treatment is needed, it may include any of the following:  A procedure to drain the cyst (aspiration). This may be done using a long needle and ultrasound. It can also be done through a laparoscopic procedure. This involves using a thin, lighted tube with a tiny camera on the end (laparoscope) inserted through a small incision.  Surgery to remove the whole cyst. This may be done using laparoscopic surgery or an open surgery involving a larger incision in the lower abdomen.  Hormone treatment or birth control pills. These methods are sometimes used to help dissolve a cyst. HOME CARE INSTRUCTIONS   Only take over-the-counter   or prescription medicines as directed by your health care provider.  Follow up with your health care provider as directed.  Get regular pelvic exams and Pap tests. SEEK MEDICAL CARE IF:   Your periods are late, irregular, or painful, or they stop.  Your pelvic pain or abdominal pain does not go away.  Your abdomen becomes  larger or swollen.  You have pressure on your bladder or trouble emptying your bladder completely.  You have pain during sexual intercourse.  You have feelings of fullness, pressure, or discomfort in your stomach.  You lose weight for no apparent reason.  You feel generally ill.  You become constipated.  You lose your appetite.  You develop acne.  You have an increase in body and facial hair.  You are gaining weight, without changing your exercise and eating habits.  You think you are pregnant. SEEK IMMEDIATE MEDICAL CARE IF:   You have increasing abdominal pain.  You feel sick to your stomach (nauseous), and you throw up (vomit).  You develop a fever that comes on suddenly.  You have abdominal pain during a bowel movement.  Your menstrual periods become heavier than usual. MAKE SURE YOU:  Understand these instructions.  Will watch your condition.  Will get help right away if you are not doing well or get worse.   This information is not intended to replace advice given to you by your health care provider. Make sure you discuss any questions you have with your health care provider.   Document Released: 07/08/2005 Document Revised: 07/13/2013 Document Reviewed: 03/15/2013 Elsevier Interactive Patient Education 2016 [employer] Inc[employer]  

## 2015-05-15 NOTE — Progress Notes (Signed)
Patient ID: Shyane Fossum, female   DOB: Nov 27, 1990, 24 y.o.   MRN: [id number]  Chief Complaint  Patient presents with  . New Patient (Initial Visit)  seen in ED for lower abdominal pain, f/u for ovarian cyst  HPI Amalya Wheat is a 24 y.o. female.  Patient's last menstrual period was 04/25/2015. [id number] She has a nexplanon in place since early 2015 Pain from her episode this month is improved, takes Naprosyn prn  HPI  Past Medical History  Diagnosis Date  . Medical history non-contributory     No past surgical history on file.  No family history on file.  Social History Social History  Substance Use Topics  . Smoking status: Never Smoker   . Smokeless tobacco: Not on file  . Alcohol Use: No    No Known Allergies  Current Outpatient Prescriptions  Medication Sig Dispense Refill  . naproxen (NAPROSYN) 250 MG tablet Take 1 tablet (250 mg total) by mouth 2 (two) times daily with a meal. 30 tablet 0   No current facility-administered medications for this visit.    Review of Systems Review of Systems  Constitutional: Negative.   Gastrointestinal: Negative.   Genitourinary: Positive for pelvic pain (mild ). Negative for dysuria, vaginal bleeding, vaginal discharge and menstrual problem.    Blood pressure 115/53, pulse 90, weight 202 lb 11.2 oz (91.944 kg), last menstrual period 04/25/2015, not currently breastfeeding.  Physical Exam Physical Exam  Constitutional: She is oriented to person, place, and time. She appears well-developed. No distress.  Cardiovascular: Normal rate.   Pulmonary/Chest: Effort normal. No respiratory distress.  Abdominal: Soft. There is no tenderness.  Genitourinary: Vagina normal and uterus normal. No vaginal discharge found.  No mass or tenderness  Neurological: She is alert and oriented to person, place, and time.  Psychiatric: She has a normal mood and affect. Her behavior is normal.    Data Reviewed CLINICAL DATA: Right lower quadrant  pain. Sudden onset.  EXAM: TRANSABDOMINAL AND TRANSVAGINAL ULTRASOUND OF PELVIS  DOPPLER ULTRASOUND OF OVARIES  TECHNIQUE: Both transabdominal and transvaginal ultrasound examinations of the pelvis were performed. Transabdominal technique was performed for global imaging of the pelvis including uterus, ovaries, adnexal regions, and pelvic cul-de-sac.  It was necessary to proceed with endovaginal exam following the transabdominal exam to visualize the endometrium and ovaries. Color and duplex Doppler ultrasound was utilized to evaluate blood flow to the ovaries.  COMPARISON: None.  FINDINGS: Uterus  Measurements: 7.1 x 3.9 x 6.4 cm. 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 No fibroids or other mass visualized.  Endometrium  Thickness: 4.7 mm. No focal abnormality visualized.  Right ovary  Measurements: 5.7 x 4.2 x 4 cm. Anechoic 4.9 x 3.7 x 3.5 cm right ovarian mass most consistent with a cyst.  Left ovary  Measurements: 3 x 1.5 x 2.5 cm. Normal appearance/no adnexal mass.  Pulsed Doppler evaluation of both ovaries demonstrates normal low-resistance arterial and venous waveforms.  Other findings  No free fluid.  IMPRESSION: 1. No ovarian torsion. 2. Right ovarian cyst.   Electronically Signed  By: Kathreen Devoid  On: 04/23/2015 14:31  CBC    Component Value Date/Time   WBC 8.8 05/04/2015 1443   RBC 5.21* 05/04/2015 1443   HGB 13.3 05/04/2015 1443   HCT 42.3 05/04/2015 1443   PLT 428* 05/04/2015 1443   MCV 81.2 05/04/2015 1443   MCH 25.5* 05/04/2015 1443   MCHC 31.4 05/04/2015 1443   RDW 13.3 05/04/2015 1443   LYMPHSABS 1.8 04/23/2015  1205   MONOABS 0.6 04/23/2015 1205   EOSABS 0.6 04/23/2015 1205   BASOSABS 0.0 04/23/2015 1205      Assessment    Lower abdominal pain improving Simple right ovarian cyst on [place] 10/02 Nl RUQ [place]      Plan    F/U US after 8 weeks Report increased pain  Reassured simple cyst will  likely resolve        Maeson Lourenco 05/15/2015, 4:11 PM

## 2015-06-22 ENCOUNTER — Ambulatory Visit (HOSPITAL_COMMUNITY)
Admission: RE | Admit: 2015-06-22 | Discharge: 2015-06-22 | Disposition: A | Discharge status: Home / Self Care | Payer: Self-pay | Source: Ambulatory Visit | Attending: Obstetrics & Gynecology | Admitting: Obstetrics & Gynecology

## 2015-06-22 DIAGNOSIS — N83201 Unspecified ovarian cyst, right side: Secondary | ICD-10-CM

## 2015-06-22 DIAGNOSIS — N83209 Unspecified ovarian cyst, unspecified side: Secondary | ICD-10-CM | POA: Insufficient documentation

## 2016-02-12 ENCOUNTER — Ambulatory Visit (HOSPITAL_COMMUNITY)
Admission: EM | Admit: 2016-02-12 | Discharge: 2016-02-12 | Disposition: A | Discharge status: Home / Self Care | Payer: Self-pay | Attending: Family Medicine | Admitting: Family Medicine

## 2016-02-12 ENCOUNTER — Encounter (HOSPITAL_COMMUNITY): Payer: Self-pay | Admitting: *Deleted

## 2016-02-12 DIAGNOSIS — R05 Cough: Secondary | ICD-10-CM

## 2016-02-12 DIAGNOSIS — G44209 Tension-type headache, unspecified, not intractable: Secondary | ICD-10-CM

## 2016-02-12 DIAGNOSIS — R059 Cough, unspecified: Secondary | ICD-10-CM

## 2016-02-12 MED ORDER — BUTALBITAL-APAP-CAFFEINE 50-325-40 MG PO TABS: 1.0000 | ORAL_TABLET | Freq: Four times a day (QID) | ORAL | 0 refills | Status: AC | PRN

## 2016-02-12 MED ORDER — BENZONATATE 100 MG PO CAPS: 200.0000 mg | ORAL_CAPSULE | Freq: Three times a day (TID) | ORAL | 0 refills | Status: DC | PRN

## 2016-02-12 NOTE — ED Triage Notes (Signed)
Headache    Cough     X  3  Days       Nausea   And  Vomiting  At   Night

## 2016-02-12 NOTE — ED Provider Notes (Signed)
CSN: [id number]     Arrival date & time 02/12/16  1626 History   None    No chief complaint on file.  (Consider location/radiation/quality/duration/timing/severity/associated sxs/prior Treatment) Patient has been coughing and now has a headache.   The history is provided by the patient.  Headache  Pain location:  Occipital, R temporal, L temporal and frontal Quality:  Dull Radiates to:  Does not radiate Severity currently:  5/10 Severity at highest:  5/10 Onset quality:  Gradual Duration:  2 days Timing:  Constant Progression:  Unchanged Chronicity:  New Similar to prior headaches: no   Context: coughing   Relieved by:  Nothing Worsened by:  Nothing   Past Medical History:  Diagnosis Date  . Medical history non-contributory    No past surgical history on file. No family history on file. Social History  Substance Use Topics  . Smoking status: Never Smoker  . Smokeless tobacco: Not on file  . Alcohol use No   OB History    Gravida Para Term Preterm AB Living   2 2 1 1   2    SAB TAB Ectopic Multiple Live Births                 Review of Systems  Constitutional: Negative.   Eyes: Negative.   Respiratory: Negative.   Cardiovascular: Negative.   Gastrointestinal: Negative.   Endocrine: Negative.   Genitourinary: Negative.   Musculoskeletal: Negative.   Skin: Negative.   Allergic/Immunologic: Negative.   Neurological: Positive for headaches.  Hematological: Negative.   Psychiatric/Behavioral: Negative.     Allergies  Review of patient's allergies indicates no known allergies.  Home Medications   Prior to Admission medications   Medication Sig Start Date End Date Taking? Authorizing Provider  benzonatate (TESSALON) 100 MG capsule Take 2 capsules (200 mg total) by mouth 3 (three) times daily as needed for cough. 02/12/16   Lysbeth Penner, FNP  butalbital-acetaminophen-caffeine (FIORICET) [id number] MG tablet Take 1-2 tablets by mouth every 6 (six) hours as  needed for headache. 02/12/16 02/11/17  Lysbeth Penner, FNP  naproxen (NAPROSYN) 250 MG tablet Take 1 tablet (250 mg total) by mouth 2 (two) times daily with a meal. 04/23/15   Waynetta Pean, PA-C   Meds Ordered and Administered this Visit  Medications - No data to display  BP 122/88 (BP Location: Left Arm)   Pulse 78   Temp 97.9 F (36.6 C) (Oral)   Resp 16   SpO2 100%  No data found.   Physical Exam  Constitutional: She is oriented to person, place, and time. She appears well-developed and well-nourished.  HENT:  Head: Normocephalic and atraumatic.  Eyes: Conjunctivae and EOM are normal. Pupils are equal, round, and reactive to light.  Neck: Normal range of motion. Neck supple.  Cardiovascular: Normal rate, regular rhythm and normal heart sounds.   Pulmonary/Chest: Effort normal and breath sounds normal.  Abdominal: Soft. Bowel sounds are normal.  Musculoskeletal:  TTP temporal region of cranium and occiput   Neurological: She is alert and oriented to person, place, and time.  Nursing note and vitals reviewed.   Urgent Care Course   Clinical Course    Procedures (including critical care time)  Labs Review Labs Reviewed - No data to display  Imaging Review No results found.   Visual Acuity Review  Right Eye Distance:   Left Eye Distance:   Bilateral Distance:    Right Eye Near:   Left Eye Near:    Bilateral  Near:         MDM   1. Tension headache   2. Cough    Tessalon perles 200mg  one po tid prn #21 fioricet 1-2 tablets q 6 hour prn #20      Lysbeth Penner, FNP 02/12/16 [id number]

## 2016-08-03 ENCOUNTER — Encounter (HOSPITAL_COMMUNITY): Payer: Self-pay | Admitting: Emergency Medicine

## 2016-08-03 ENCOUNTER — Emergency Department (HOSPITAL_COMMUNITY)
Admission: EM | Admit: 2016-08-03 | Discharge: 2016-08-03 | Disposition: A | Discharge status: Home / Self Care | Payer: Self-pay | Attending: Emergency Medicine | Admitting: Emergency Medicine

## 2016-08-03 DIAGNOSIS — Z308 Encounter for other contraceptive management: Secondary | ICD-10-CM | POA: Insufficient documentation

## 2016-08-03 NOTE — ED Provider Notes (Signed)
[facility] DEPT Provider Note   CSN: [id number] Arrival date & time: 08/03/16  1019  By signing my name below, I, [name], attest that this documentation has been prepared under the direction and in the presence of Janetta Hora, PA-C.  Electronically Signed: [name], ED Scribe. 08/03/16. 11:43 AM.  History   Chief Complaint Chief Complaint  Patient presents with  . birth control implant removal   The history is provided by the patient and medical records. No language interpreter was used.    HPI Comments: Stephanie Holmes is a 26 y.o. female with no pertinent PMHx, who presents to the Emergency Department requesting removal of her Nexplanon implant to the left arm. Pt has had this implant for approximally three years and states that it is due to be removed. She notes additionally that this area has been in mild pain over the past several weeks. Otherwise the area has had not presented with complication. Pt is currently followed by Dr Willis Modena for her OBGYN care but states they did not go for removal because they couldn't pay. There are no other associated symptoms. There are no alleviating or modifying factors. Pt denies fever, or any other associated symptoms.   Past Medical History:  Diagnosis Date  . Medical history non-contributory    Patient Active Problem List   Diagnosis Date Noted  . Nexplanon in place 05/15/2015   No past surgical history on file.  OB History    Gravida Para Term Preterm AB Living   2 2 1 1   2    SAB TAB Ectopic Multiple Live Births           1     Home Medications    Prior to Admission medications   Medication Sig Start Date End Date Taking? Authorizing Provider  benzonatate (TESSALON) 100 MG capsule Take 2 capsules (200 mg total) by mouth 3 (three) times daily as needed for cough. 02/12/16   Lysbeth Penner, FNP  butalbital-acetaminophen-caffeine (FIORICET) [phone] MG tablet Take 1-2 tablets by mouth every 6 (six) hours as  needed for headache. 02/12/16 02/11/17  Lysbeth Penner, FNP  naproxen (NAPROSYN) 250 MG tablet Take 1 tablet (250 mg total) by mouth 2 (two) times daily with a meal. 04/23/15   Waynetta Pean, PA-C   Family History No family history on file.  Social History Social History  Substance Use Topics  . Smoking status: Never Smoker  . Smokeless tobacco: Never Used  . Alcohol use No   Allergies   Patient has no known allergies.  Review of Systems Review of Systems  Constitutional: Negative for fever.  Musculoskeletal: Positive for myalgias.   Physical Exam Updated Vital Signs BP 119/82   Pulse 82   Temp 98 F (36.7 C)   Resp 17   SpO2 99%   Physical Exam  Constitutional: She appears well-developed and well-nourished. No distress.  HENT:  Head: Normocephalic and atraumatic.  Eyes: Conjunctivae are normal.  Neck: Normal range of motion.  Cardiovascular: Normal rate.   Pulmonary/Chest: Effort normal.  Abdominal: She exhibits no distension.  Musculoskeletal: Normal range of motion.  Neurological: She is alert.  Skin: No pallor.  Implanon in left upper arm without significant tenderness or erythema  Psychiatric: She has a normal mood and affect. Her behavior is normal.  Nursing note and vitals reviewed.  ED Treatments / Results  DIAGNOSTIC STUDIES: Oxygen Saturation is 99% on RA, normal by my interpretation.   COORDINATION OF CARE: 11:43 AM-Discussed  next steps with pt. Pt verbalized understanding and is agreeable with the plan.   Labs (all labs ordered are listed, but only abnormal results are displayed) Labs Reviewed - No data to display  EKG  EKG Interpretation None      Radiology No results found.  Procedures Procedures   Medications Ordered in ED Medications - No data to display  Initial Impression / Assessment and Plan / ED Course  I have reviewed the triage vital signs and the nursing notes.  Pertinent labs & imaging results that were available  during my care of the patient were reviewed by me and considered in my medical decision making (see chart for details).  Clinical Course    26 year old female presents for removal of birth control implant. Patient is afebrile, not tachycardic or tachypneic, normotensive, and not hypoxic. Site is minimally tender and not infected. Advised to go to [facility] clinic for removal. Patient is NAD, non-toxic, with stable VS. Patient is informed of clinical course, understands medical decision making process, and agrees with plan. Opportunity for questions provided and all questions answered. Return precautions given.   Final Clinical Impressions(s) / ED Diagnoses   Final diagnoses:  Encounter for other contraceptive management   New Prescriptions New Prescriptions   No medications on file   I personally performed the services described in this documentation, which was scribed in my presence. The recorded information has been reviewed and is accurate.     Recardo Evangelist, PA-C 08/04/16 [place], MD 08/04/16 [phone]

## 2016-08-03 NOTE — ED Notes (Signed)
Declined W/C at D/C and was escorted to lobby by RN. 

## 2016-08-03 NOTE — ED Triage Notes (Signed)
Pt reports birth control implant to left arm, had it for 3 years and wants it removed.

## 2017-07-22 NOTE — L&D Delivery Note (Addendum)
Delivery Note At 2:31 AM a viable and healthy female was delivered via Vaginal, Spontaneous (Presentation: ROA ).  APGAR: 8, 9; weight pending.   Placenta status: spontaneous , intact .  Cord: 3 vessel with the following complications: none .    Anesthesia:  IV Fentanyl Episiotomy: None Lacerations: None Suture Repair: n/a Est. Blood Loss (mL):  100  Mom to postpartum.  Baby to Couplet care / Skin to Skin.  Stephanie Holmes is a 27 y.o. female [id number] with IUP at [redacted]w[redacted]d admitted for PROM.  She progressed without augmentation to complete and pushed less than 30 minutes to deliver.  Loose nuchal x1 reduced easily at delivery. Cord clamping delayed by 1 minute then clamped by CNM and cut by FOB.  Placenta intact and spontaneous, bleeding minimal.  Intact perineum.  Mom and baby stable prior to transfer to postpartum. She plans on breastfeeding. She requests Depo for birth control.   [name] 02/16/2018, 2:52 AM

## 2017-11-24 ENCOUNTER — Inpatient Hospital Stay (HOSPITAL_COMMUNITY)
Admission: AD | Admit: 2017-11-24 | Discharge: 2017-11-24 | Disposition: A | Discharge status: Home / Self Care | Payer: Managed Care, Other (non HMO) | Source: Ambulatory Visit | Attending: Obstetrics and Gynecology | Admitting: Obstetrics and Gynecology

## 2017-11-24 ENCOUNTER — Encounter (HOSPITAL_COMMUNITY): Payer: Self-pay

## 2017-11-24 DIAGNOSIS — O0932 Supervision of pregnancy with insufficient antenatal care, second trimester: Secondary | ICD-10-CM | POA: Diagnosis not present

## 2017-11-24 DIAGNOSIS — Z3200 Encounter for pregnancy test, result unknown: Secondary | ICD-10-CM

## 2017-11-24 DIAGNOSIS — Z3A26 26 weeks gestation of pregnancy: Secondary | ICD-10-CM | POA: Insufficient documentation

## 2017-11-24 DIAGNOSIS — O0933 Supervision of pregnancy with insufficient antenatal care, third trimester: Secondary | ICD-10-CM

## 2017-11-24 DIAGNOSIS — O09212 Supervision of pregnancy with history of pre-term labor, second trimester: Secondary | ICD-10-CM | POA: Insufficient documentation

## 2017-11-24 LAB — URINALYSIS, ROUTINE W REFLEX MICROSCOPIC
Bilirubin Urine: NEGATIVE
GLUCOSE, UA: NEGATIVE mg/dL
KETONES UR: NEGATIVE mg/dL
NITRITE: NEGATIVE
PH: 6 (ref 5.0–8.0)
PROTEIN: NEGATIVE mg/dL
Specific Gravity, Urine: 1.008 (ref 1.005–1.030)

## 2017-11-24 LAB — CBC
HCT: 35.6 % — ABNORMAL LOW (ref 36.0–46.0)
HEMOGLOBIN: 11.4 g/dL — AB (ref 12.0–15.0)
MCH: 25.3 pg — AB (ref 26.0–34.0)
MCHC: 32 g/dL (ref 30.0–36.0)
MCV: 78.9 fL (ref 78.0–100.0)
PLATELETS: 244 10*3/uL (ref 150–400)
RBC: 4.51 MIL/uL (ref 3.87–5.11)
RDW: 14.6 % (ref 11.5–15.5)
WBC: 10.2 10*3/uL (ref 4.0–10.5)

## 2017-11-24 LAB — DIFFERENTIAL
BASOS ABS: 0 10*3/uL (ref 0.0–0.1)
Basophils Relative: 0 %
EOS ABS: 0.4 10*3/uL (ref 0.0–0.7)
EOS PCT: 4 %
LYMPHS ABS: 2.7 10*3/uL (ref 0.7–4.0)
Lymphocytes Relative: 27 %
Monocytes Absolute: 0.6 10*3/uL (ref 0.1–1.0)
Monocytes Relative: 6 %
NEUTROS PCT: 63 %
Neutro Abs: 6.5 10*3/uL (ref 1.7–7.7)

## 2017-11-24 LAB — WET PREP, GENITAL
TRICH WET PREP: NONE SEEN
Yeast Wet Prep HPF POC: NONE SEEN

## 2017-11-24 MED ORDER — PRENATAL 27-0.8 MG PO TABS: 1.0000 | ORAL_TABLET | Freq: Every day | ORAL | 11 refills | Status: DC

## 2017-11-24 NOTE — Discharge Instructions (Signed)

## 2017-11-24 NOTE — MAU Note (Addendum)
Pt here with pain. Denies any bleeding or leaking. Reports good fetal movement. No prenatal care.

## 2017-11-24 NOTE — MAU Provider Note (Signed)
History     CSN: [id number]  Arrival date and time: 11/24/17 1943   First Provider Initiated Contact with Patient 11/24/17 2044      Chief Complaint  Patient presents with  . Check up   HPI Stephanie Holmes is a 27 y.o. G3P1102 at approximately [redacted]w[redacted]d who presents for a check up. She reports no period since November and thinks she is feeling movement in her abdomen. She denies any pain, vaginal bleeding, leaking of fluid or discharge. Reports fetal movement. Has not been seen in this pregnancy yet because she did not know if she was pregnant.   OB History    Gravida  3   Para  2   Term  1   Preterm  1   AB      Living  2     SAB      TAB      Ectopic      Multiple      Live Births  1           Past Medical History:  Diagnosis Date  . Medical history non-contributory   . Preterm labor     Past Surgical History:  Procedure Laterality Date  . NO PAST SURGERIES      No family history on file.  Social History   Tobacco Use  . Smoking status: Never Smoker  . Smokeless tobacco: Never Used  Substance Use Topics  . Alcohol use: No  . Drug use: Never    Allergies: No Known Allergies  Medications Prior to Admission  Medication Sig Dispense Refill Last Dose  . benzonatate (TESSALON) 100 MG capsule Take 2 capsules (200 mg total) by mouth 3 (three) times daily as needed for cough. 21 capsule 0   . naproxen (NAPROSYN) 250 MG tablet Take 1 tablet (250 mg total) by mouth 2 (two) times daily with a meal. 30 tablet 0 Taking    Review of Systems  Constitutional: Negative.  Negative for fatigue and fever.  HENT: Negative.   Respiratory: Negative.  Negative for shortness of breath.   Cardiovascular: Negative.  Negative for chest pain.  Gastrointestinal: Negative.  Negative for abdominal pain, constipation, diarrhea, nausea and vomiting.  Genitourinary: Negative.  Negative for dysuria.  Neurological: Negative.  Negative for dizziness and headaches.   Physical  Exam   Pulse 94, temperature 98.6 F (37 C), temperature source Oral, resp. rate 20, height 5\' 5"  (1.651 m), weight 212 lb (96.2 kg), last menstrual period 05/26/2017, SpO2 100 %.  Physical Exam  Nursing note and vitals reviewed. Constitutional: She is oriented to person, place, and time. She appears well-developed and well-nourished. No distress.  HENT:  Head: Normocephalic.  Eyes: Pupils are equal, round, and reactive to light.  Cardiovascular: Normal rate, regular rhythm and normal heart sounds.  Respiratory: Effort normal and breath sounds normal. No respiratory distress.  GI: Soft. Bowel sounds are normal. She exhibits no distension. There is no tenderness.  Gravid abdomen. Fundal height measures approximately 28-[redacted] weeks gestation  Neurological: She is alert and oriented to person, place, and time.  Skin: Skin is warm and dry.  Psychiatric: She has a normal mood and affect. Her behavior is normal. Judgment and thought content normal.   Fetal Tracing:  Baseline: 140 Variability: moderate Accels: 10x10 Decels: none  Toco: none  MAU Course  Procedures Results for orders placed or performed during the hospital encounter of 11/24/17 (from the past 24 hour(s))  Urinalysis, Routine w reflex microscopic  Status: Abnormal   Collection Time: 11/24/17  8:07 PM  Result Value Ref Range   Color, Urine YELLOW YELLOW   APPearance CLEAR CLEAR   Specific Gravity, Urine 1.008 1.005 - 1.030   pH 6.0 5.0 - 8.0   Glucose, UA NEGATIVE NEGATIVE mg/dL   Hgb urine dipstick SMALL (A) NEGATIVE   Bilirubin Urine NEGATIVE NEGATIVE   Ketones, ur NEGATIVE NEGATIVE mg/dL   Protein, ur NEGATIVE NEGATIVE mg/dL   Nitrite NEGATIVE NEGATIVE   Leukocytes, UA SMALL (A) NEGATIVE   RBC / HPF 0-5 0 - 5 RBC/hpf   WBC, UA 0-5 0 - 5 WBC/hpf   Bacteria, UA RARE (A) NONE SEEN   Squamous Epithelial / LPF 0-5 0 - 5   Mucus PRESENT    Sperm, UA PRESENT    MDM UA, UC OB Panel Wet prep and  gc/chlamydia  Due to patient and family's language barrier with a history of preterm birth, patient given urgent appointment for NOB in clinic. OB labs and cultures done tonight. Outpatient complete ultrasound ordered.   Assessment and Plan   1. [redacted] weeks gestation of pregnancy   2. Possible pregnancy   3. No prenatal care in current pregnancy in third trimester    -Discharge home in stable condition -Rx for prenatal vitamins -Preterm labor precautions discussed -Patient advised to follow-up with [facility] on Friday for an OB appointment -Patient may return to MAU as needed or if her condition were to change or worsen  [place] 11/24/2017, 9:17 PM

## 2017-11-25 LAB — RPR: RPR Ser Ql: NONREACTIVE

## 2017-11-25 LAB — GC/CHLAMYDIA PROBE AMP (~~LOC~~) NOT AT ARMC
Chlamydia: NEGATIVE
Neisseria Gonorrhea: NEGATIVE

## 2017-11-25 LAB — HIV ANTIBODY (ROUTINE TESTING W REFLEX): HIV Screen 4th Generation wRfx: NONREACTIVE

## 2017-11-25 LAB — HEPATITIS B SURFACE ANTIGEN: Hepatitis B Surface Ag: NEGATIVE

## 2017-11-26 LAB — CULTURE, OB URINE

## 2017-11-26 LAB — RUBELLA SCREEN: Rubella: 22.5 index (ref >0.99)

## 2017-11-28 ENCOUNTER — Other Ambulatory Visit: Payer: Self-pay | Admitting: *Deleted

## 2017-11-28 ENCOUNTER — Encounter: Payer: Managed Care, Other (non HMO) | Admitting: Obstetrics & Gynecology

## 2017-11-28 ENCOUNTER — Other Ambulatory Visit: Payer: Managed Care, Other (non HMO)

## 2017-11-28 DIAGNOSIS — O0992 Supervision of high risk pregnancy, unspecified, second trimester: Secondary | ICD-10-CM

## 2017-11-28 LAB — POCT URINALYSIS DIP (DEVICE)
BILIRUBIN URINE: NEGATIVE
Glucose, UA: NEGATIVE mg/dL
Ketones, ur: NEGATIVE mg/dL
NITRITE: NEGATIVE
PH: 6.5 (ref 5.0–8.0)
Protein, ur: NEGATIVE mg/dL
Urobilinogen, UA: 0.2 mg/dL (ref 0.0–1.0)

## 2017-12-02 LAB — URINE CULTURE, OB REFLEX

## 2017-12-02 LAB — CULTURE, OB URINE

## 2017-12-09 ENCOUNTER — Encounter (HOSPITAL_COMMUNITY): Payer: Self-pay

## 2017-12-09 ENCOUNTER — Ambulatory Visit (HOSPITAL_COMMUNITY)
Admission: RE | Admit: 2017-12-09 | Discharge: 2017-12-09 | Disposition: A | Discharge status: Home / Self Care | Payer: Managed Care, Other (non HMO) | Source: Ambulatory Visit | Attending: Obstetrics & Gynecology | Admitting: Obstetrics & Gynecology

## 2017-12-09 ENCOUNTER — Other Ambulatory Visit: Payer: Self-pay | Admitting: Obstetrics & Gynecology

## 2017-12-09 DIAGNOSIS — O99213 Obesity complicating pregnancy, third trimester: Secondary | ICD-10-CM

## 2017-12-09 DIAGNOSIS — Z3A28 28 weeks gestation of pregnancy: Secondary | ICD-10-CM

## 2017-12-09 DIAGNOSIS — O09219 Supervision of pregnancy with history of pre-term labor, unspecified trimester: Secondary | ICD-10-CM

## 2017-12-09 DIAGNOSIS — O09899 Supervision of other high risk pregnancies, unspecified trimester: Secondary | ICD-10-CM

## 2017-12-09 DIAGNOSIS — Z363 Encounter for antenatal screening for malformations: Secondary | ICD-10-CM | POA: Insufficient documentation

## 2017-12-09 DIAGNOSIS — O0992 Supervision of high risk pregnancy, unspecified, second trimester: Secondary | ICD-10-CM

## 2017-12-09 DIAGNOSIS — O09213 Supervision of pregnancy with history of pre-term labor, third trimester: Secondary | ICD-10-CM | POA: Insufficient documentation

## 2017-12-09 DIAGNOSIS — O0930 Supervision of pregnancy with insufficient antenatal care, unspecified trimester: Secondary | ICD-10-CM

## 2017-12-09 DIAGNOSIS — O0933 Supervision of pregnancy with insufficient antenatal care, third trimester: Secondary | ICD-10-CM | POA: Insufficient documentation

## 2017-12-10 LAB — OBSTETRIC PANEL, INCLUDING HIV
ANTIBODY SCREEN: NEGATIVE
BASOS ABS: 0 10*3/uL (ref 0.0–0.2)
BASOS: 0 %
EOS (ABSOLUTE): 0.2 10*3/uL (ref 0.0–0.4)
Eos: 3 %
HEMATOCRIT: 36 % (ref 34.0–46.6)
HIV SCREEN 4TH GENERATION: NONREACTIVE
Hemoglobin: 11.5 g/dL (ref 11.1–15.9)
Hepatitis B Surface Ag: NEGATIVE
Immature Grans (Abs): 0.1 10*3/uL (ref 0.0–0.1)
Immature Granulocytes: 1 %
LYMPHS: 18 %
Lymphocytes Absolute: 1.6 10*3/uL (ref 0.7–3.1)
MCH: 24.3 pg — ABNORMAL LOW (ref 26.6–33.0)
MCHC: 31.9 g/dL (ref 31.5–35.7)
MCV: 76 fL — ABNORMAL LOW (ref 79–97)
MONOCYTES: 7 %
Monocytes Absolute: 0.6 10*3/uL (ref 0.1–0.9)
NEUTROS PCT: 71 %
Neutrophils Absolute: 6.3 10*3/uL (ref 1.4–7.0)
PLATELETS: 260 10*3/uL (ref 150–379)
RBC: 4.73 x10E6/uL (ref 3.77–5.28)
RDW: 14.3 % (ref 12.3–15.4)
RH TYPE: POSITIVE
RPR Ser Ql: NONREACTIVE
RUBELLA: 18.2 {index} (ref >0.99)
WBC: 8.9 10*3/uL (ref 3.4–10.8)

## 2017-12-10 LAB — SMN1 COPY NUMBER ANALYSIS (SMA CARRIER SCREENING)

## 2017-12-10 LAB — HEMOGLOBINOPATHY EVALUATION
Ferritin: 160 ng/mL — ABNORMAL HIGH (ref 15–150)
HGB A2 QUANT: 2.2 % (ref 1.8–3.2)
HGB A: 97.8 % (ref 96.4–98.8)
HGB S: 0 %
HGB VARIANT: 0 %
Hgb C: 0 %
Hgb F Quant: 0 % (ref 0.0–2.0)
Hgb Solubility: NEGATIVE

## 2017-12-10 LAB — ALPHA-THALASSEMIA

## 2017-12-10 LAB — CYSTIC FIBROSIS GENE TEST

## 2017-12-12 ENCOUNTER — Other Ambulatory Visit: Payer: Self-pay | Admitting: Obstetrics and Gynecology

## 2017-12-12 ENCOUNTER — Encounter: Payer: Self-pay | Admitting: Obstetrics and Gynecology

## 2017-12-12 ENCOUNTER — Other Ambulatory Visit: Payer: Self-pay

## 2017-12-12 ENCOUNTER — Ambulatory Visit (INDEPENDENT_AMBULATORY_CARE_PROVIDER_SITE_OTHER): Payer: Managed Care, Other (non HMO) | Admitting: Obstetrics and Gynecology

## 2017-12-12 VITALS — BP 112/69 | HR 96 | Wt 213.0 lb

## 2017-12-12 DIAGNOSIS — Z789 Other specified health status: Secondary | ICD-10-CM | POA: Insufficient documentation

## 2017-12-12 DIAGNOSIS — O09293 Supervision of pregnancy with other poor reproductive or obstetric history, third trimester: Secondary | ICD-10-CM

## 2017-12-12 DIAGNOSIS — O0993 Supervision of high risk pregnancy, unspecified, third trimester: Secondary | ICD-10-CM

## 2017-12-12 DIAGNOSIS — O0933 Supervision of pregnancy with insufficient antenatal care, third trimester: Secondary | ICD-10-CM

## 2017-12-12 DIAGNOSIS — Z8751 Personal history of pre-term labor: Secondary | ICD-10-CM | POA: Insufficient documentation

## 2017-12-12 DIAGNOSIS — O09213 Supervision of pregnancy with history of pre-term labor, third trimester: Secondary | ICD-10-CM

## 2017-12-12 DIAGNOSIS — O99213 Obesity complicating pregnancy, third trimester: Secondary | ICD-10-CM

## 2017-12-12 DIAGNOSIS — Z862 Personal history of diseases of the blood and blood-forming organs and certain disorders involving the immune mechanism: Secondary | ICD-10-CM

## 2017-12-12 DIAGNOSIS — O0930 Supervision of pregnancy with insufficient antenatal care, unspecified trimester: Secondary | ICD-10-CM

## 2017-12-12 DIAGNOSIS — O0992 Supervision of high risk pregnancy, unspecified, second trimester: Secondary | ICD-10-CM

## 2017-12-12 DIAGNOSIS — O09299 Supervision of pregnancy with other poor reproductive or obstetric history, unspecified trimester: Secondary | ICD-10-CM | POA: Insufficient documentation

## 2017-12-12 DIAGNOSIS — Z8759 Personal history of other complications of pregnancy, childbirth and the puerperium: Secondary | ICD-10-CM | POA: Insufficient documentation

## 2017-12-12 DIAGNOSIS — Z758 Other problems related to medical facilities and other health care: Secondary | ICD-10-CM | POA: Insufficient documentation

## 2017-12-12 DIAGNOSIS — O99210 Obesity complicating pregnancy, unspecified trimester: Secondary | ICD-10-CM

## 2017-12-12 DIAGNOSIS — Q513 Bicornate uterus: Secondary | ICD-10-CM

## 2017-12-12 DIAGNOSIS — E669 Obesity, unspecified: Secondary | ICD-10-CM

## 2017-12-12 HISTORY — DX: Supervision of pregnancy with insufficient antenatal care, unspecified trimester: O09.30

## 2017-12-12 HISTORY — DX: Supervision of high risk pregnancy, unspecified, second trimester: O09.92

## 2017-12-12 NOTE — Patient Instructions (Signed)

## 2017-12-12 NOTE — Progress Notes (Signed)
New OB Note  12/12/2017   CC:  Chief Complaint  Patient presents with  . Initial Prenatal Visit    Transfer of Care Patient: no  The patient is from [place] and speaks limited [place], her friend is helping with translating and the language line does not have an available interpreter currently.  History of Present Illness: Stephanie Holmes is a 27 y.o. G3P1102 at [redacted]w[redacted]d by midtrimester ultrasound being seen today for her first obstetrical visit. Patient states that she is just now getting care because she did not know she was pregnant. Thought she had a growing "mass". She is unsure of her obstetrical history. Her first pregnancy and delivery was in [place] and second pregnancy delivered here in [facility]. However from chart reviewing her obstetrical history is significant for pre-eclampsia and PPH, preterm delivery, and bicornuate uterus.. This was an unplanned pregnancy. Relationship with FOB: married. Patient plans to breast and bottle feed. Patient unaware of what she wants to do for birth control. Prior to pregnancy she had Nexplanon which was removed in September 2018.   Pregnancy history as fully reviewed as possible with language barrier.  Her periods were: irregular  She has Negative signs or symptoms of nausea/vomiting of pregnancy. She has Negative signs or symptoms of miscarriage or preterm labor  Patient reports no complaints. Feeling baby move. Denies vaginal bleeding, vaginal discharge, or LOF.    ROS: A 12-point review of systems was performed and negative, except as stated in the above HPI.   OBGYN History: OB History  Gravida Para Term Preterm AB Living  3 2 1 1   2   SAB TAB Ectopic Multiple Live Births          1    # Outcome Date GA Lbr Len/2nd Weight Sex Delivery Anes PTL Lv  3 Current           2 Preterm 06/21/13 [redacted]w[redacted]d 12:53 / 00:10 4 lb 12.5 oz (2.169 kg) F Vag-Spont None  LIV  1 Term 2009    M Vag-Spont       GYN Hx: unknown  Past Medical History: Past  Medical History:  Diagnosis Date  . Medical history non-contributory   . Preterm labor     Past Surgical History: Past Surgical History:  Procedure Laterality Date  . NO PAST SURGERIES      Family History:  No family history on file.  Social History:  Social History   Socioeconomic History  . Marital status: Married    Spouse name: Not on file  . Number of children: Not on file  . Years of education: Not on file  . Highest education level: Not on file  Occupational History  . Not on file  Social Needs  . Financial resource strain: Not on file  . Food insecurity:    Worry: Not on file    Inability: Not on file  . Transportation needs:    Medical: Not on file    Non-medical: Not on file  Tobacco Use  . Smoking status: Never Smoker  . Smokeless tobacco: Never Used  Substance and Sexual Activity  . Alcohol use: No  . Drug use: Never  . Sexual activity: Not Currently    Birth control/protection: None  Lifestyle  . Physical activity:    Days per week: Not on file    Minutes per session: Not on file  . Stress: Not on file  Relationships  . Social connections:    Talks on phone: Not  on file    Gets together: Not on file    Attends religious service: Not on file    Active member of club or organization: Not on file    Attends meetings of clubs or organizations: Not on file    Relationship status: Not on file  . Intimate partner violence:    Fear of current or ex partner: Not on file    Emotionally abused: Not on file    Physically abused: Not on file    Forced sexual activity: Not on file  Other Topics Concern  . Not on file  Social History Narrative  . Not on file    Allergy: No Known Allergies  Current Outpatient Medications:  Current Outpatient Medications:  .  Prenatal Vit-Fe Fumarate-FA (MULTIVITAMIN-PRENATAL) 27-0.8 MG TABS tablet, Take 1 tablet by mouth daily at 12 noon. (Patient not taking: Reported on 12/09/2017), Disp: 30 each, Rfl:  11  Physical Exam:   BP 112/69   Pulse 96   Wt 213 lb (96.6 kg)   LMP 05/26/2017 (Approximate)   BMI 35.45 kg/m  Body mass index is 35.45 kg/m. Contractions: Irregular  . Fundal height: 30 FHTs: 145 bpm  General appearance: Well nourished, well developed female in no acute distress.  Neck:  Supple, normal appearance, and no thyromegaly  Cardiovascular: regular rate and rhythm Respiratory:  Clear to auscultation bilateral. Normal respiratory effort Abdomen: soft, non-tender, graivd appropriate for gestation. Some distention noted.  Breasts:not examined. Genitalia:  Deferred  Neuro/Psych:  Normal mood and affect.  Skin:  Warm and dry.    Assessment/Plan: [id number] [redacted]w[redacted]d  1. Supervision of high risk pregnancy in second trimester Doing well. Initital OB visit. Had initial OB labs drawn prior to visit. Reviewed labs and unremarkable. Will need pap smear postpartum. Anatomy US already completed. To schedule 2hr GTT ASAP.   - POCT urinalysis dipstick - Genetic Screening  2. Late prenatal care  3. History of pre-eclampsia in prior pregnancy, currently pregnant Patient was unaware of this history but per chart review had in 2014 with last child. BPs stable currently. Asymptomatic. Will collect initial [place] labs. No benefit of ASA at this point.  - Protein / creatinine ratio, urine - Comprehensive metabolic panel  4. History of preterm labor Had PPROM and PTL. [place] 3 days ago with normal cervical length without funneling (3.41cm). Now in third trimester. No interventions needed at this time.   5. History of postpartum hemorrhage Per chart review due to ?retained products vs boggy uterus. Also had lacerations s/p delivery that had to be repaired.   6. 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   7. Language barrier No interpretor available for this visit. The patient is from [place] and speaks limited [place], her friend helped translate for this encounter.    8. Obesity  in pregnancy Body mass index is 35.45 kg/m.    Continue prenatal vitamins. Genetic Screening discussed, NIPS: ordered. Problem list reviewed and updated. Routine obstetric precautions reviewed. Return in about 2 weeks (around 12/26/2017) for ob visit.    Luiz Blare, DO OB Fellow Center for [facility], [facility]

## 2017-12-13 LAB — COMPREHENSIVE METABOLIC PANEL
ALT: 19 IU/L (ref 0–32)
AST: 19 IU/L (ref 0–40)
Albumin/Globulin Ratio: 1.3 (ref 1.2–2.2)
Albumin: 3.8 g/dL (ref 3.5–5.5)
Alkaline Phosphatase: 74 IU/L (ref 39–117)
BUN/Creatinine Ratio: 12 (ref 9–23)
BUN: 6 mg/dL (ref 6–20)
CHLORIDE: 103 mmol/L (ref 96–106)
CO2: 21 mmol/L (ref 20–29)
Calcium: 9 mg/dL (ref 8.7–10.2)
Creatinine, Ser: 0.5 mg/dL — ABNORMAL LOW (ref 0.57–1.00)
GFR, EST AFRICAN AMERICAN: 151 mL/min/{1.73_m2} (ref >59)
GFR, EST NON AFRICAN AMERICAN: 131 mL/min/{1.73_m2} (ref >59)
GLOBULIN, TOTAL: 3 g/dL (ref 1.5–4.5)
Glucose: 95 mg/dL (ref 65–99)
Potassium: 3.9 mmol/L (ref 3.5–5.2)
SODIUM: 136 mmol/L (ref 134–144)
Total Protein: 6.8 g/dL (ref 6.0–8.5)

## 2017-12-13 LAB — PROTEIN / CREATININE RATIO, URINE
CREATININE, UR: 20 mg/dL
PROTEIN UR: 7.4 mg/dL
Protein/Creat Ratio: 370 mg/g creat — ABNORMAL HIGH (ref 0–200)

## 2017-12-16 ENCOUNTER — Encounter: Payer: Self-pay | Admitting: *Deleted

## 2017-12-19 ENCOUNTER — Encounter: Payer: Self-pay | Admitting: Obstetrics and Gynecology

## 2017-12-19 DIAGNOSIS — O1210 Gestational proteinuria, unspecified trimester: Secondary | ICD-10-CM

## 2017-12-19 HISTORY — DX: Gestational proteinuria, unspecified trimester: O12.10

## 2017-12-26 ENCOUNTER — Other Ambulatory Visit: Payer: Medicaid Other

## 2017-12-26 ENCOUNTER — Other Ambulatory Visit: Payer: Self-pay | Admitting: General Practice

## 2017-12-26 ENCOUNTER — Encounter: Payer: Self-pay | Admitting: *Deleted

## 2017-12-26 ENCOUNTER — Ambulatory Visit (INDEPENDENT_AMBULATORY_CARE_PROVIDER_SITE_OTHER): Payer: Medicaid Other | Admitting: Obstetrics & Gynecology

## 2017-12-26 VITALS — BP 117/69 | HR 87 | Wt 213.0 lb

## 2017-12-26 DIAGNOSIS — O99213 Obesity complicating pregnancy, third trimester: Secondary | ICD-10-CM

## 2017-12-26 DIAGNOSIS — Z758 Other problems related to medical facilities and other health care: Secondary | ICD-10-CM

## 2017-12-26 DIAGNOSIS — O09299 Supervision of pregnancy with other poor reproductive or obstetric history, unspecified trimester: Secondary | ICD-10-CM

## 2017-12-26 DIAGNOSIS — O0933 Supervision of pregnancy with insufficient antenatal care, third trimester: Secondary | ICD-10-CM

## 2017-12-26 DIAGNOSIS — O0992 Supervision of high risk pregnancy, unspecified, second trimester: Secondary | ICD-10-CM

## 2017-12-26 DIAGNOSIS — O0993 Supervision of high risk pregnancy, unspecified, third trimester: Secondary | ICD-10-CM

## 2017-12-26 DIAGNOSIS — Z23 Encounter for immunization: Secondary | ICD-10-CM

## 2017-12-26 DIAGNOSIS — Q513 Bicornate uterus: Secondary | ICD-10-CM

## 2017-12-26 DIAGNOSIS — Z789 Other specified health status: Secondary | ICD-10-CM

## 2017-12-26 DIAGNOSIS — O99210 Obesity complicating pregnancy, unspecified trimester: Secondary | ICD-10-CM

## 2017-12-26 DIAGNOSIS — O09293 Supervision of pregnancy with other poor reproductive or obstetric history, third trimester: Secondary | ICD-10-CM

## 2017-12-26 DIAGNOSIS — O0930 Supervision of pregnancy with insufficient antenatal care, unspecified trimester: Secondary | ICD-10-CM

## 2017-12-26 LAB — HEMOGLOBIN A1C: HEMOGLOBIN A1C: 5.2

## 2017-12-26 NOTE — Progress Notes (Signed)
   PRENATAL VISIT NOTE  Subjective:  Stephanie Holmes is a 27 y.o. G3P1102 at [redacted]w[redacted]d being seen today for ongoing prenatal care.  She is currently monitored for the following issues for this low-risk pregnancy and has Late prenatal care; Supervision of high risk pregnancy in second trimester; History of pre-eclampsia in prior pregnancy, currently pregnant; History of preterm labor; History of postpartum hemorrhage; Bicornuate uterus; Language barrier; Obesity in pregnancy; and Proteinuria affecting pregnancy on their problem list.  Patient reports no complaints.  Contractions: Not present. Vag. Bleeding: None.  Movement: Present. Denies leaking of fluid.   The following portions of the patient's history were reviewed and updated as appropriate: allergies, current medications, past family history, past medical history, past social history, past surgical history and problem list. Problem list updated.  Objective:   Vitals:   12/26/17 0902  BP: 117/69  Pulse: 87  Weight: 213 lb (96.6 kg)    Fetal Status: Fetal Heart Rate (bpm): 144   Movement: Present     General:  Alert, oriented and cooperative. Patient is in no acute distress.  Skin: Skin is warm and dry. No rash noted.   Cardiovascular: Normal heart rate noted  Respiratory: Normal respiratory effort, no problems with respiration noted  Abdomen: Soft, gravid, appropriate for gestational age.  Pain/Pressure: Absent     Pelvic: Cervical exam deferred        Extremities: Normal range of motion.  Edema: None  Mental Status: Normal mood and affect. Normal behavior. Normal judgment and thought content.   Assessment and Plan:  Pregnancy: G3P1102 at [redacted]w[redacted]d  1. Bicornuate uterus   2. Supervision of high risk pregnancy in second trimester - 2 hour GTT and labs today - Tdap vaccine greater than or equal to 7yo IM  3. Obesity in pregnancy  - Hemoglobin A1c  4. Late prenatal care   5. Language barrier - Since no interpretor available, her  sister in law interpreted for the exam  6. History of pre-eclampsia in prior pregnancy, currently pregnant   Preterm labor symptoms and general obstetric precautions including but not limited to vaginal bleeding, contractions, leaking of fluid and fetal movement were reviewed in detail with the patient. Please refer to After Visit Summary for other counseling recommendations.  No follow-ups on file.  Future Appointments  Date Time Provider Department Center  12/26/2017 11:15 AM Emily Filbert, MD [facility]    Emily Filbert, MD

## 2017-12-27 LAB — CBC
Hematocrit: 37.3 % (ref 34.0–46.6)
Hemoglobin: 11.4 g/dL (ref 11.1–15.9)
MCH: 24.6 pg — AB (ref 26.6–33.0)
MCHC: 30.6 g/dL — ABNORMAL LOW (ref 31.5–35.7)
MCV: 81 fL (ref 79–97)
PLATELETS: 247 10*3/uL (ref 150–450)
RBC: 4.63 x10E6/uL (ref 3.77–5.28)
RDW: 15 % (ref 12.3–15.4)
WBC: 8.1 10*3/uL (ref 3.4–10.8)

## 2017-12-27 LAB — RPR: RPR Ser Ql: NONREACTIVE

## 2017-12-27 LAB — HIV ANTIBODY (ROUTINE TESTING W REFLEX): HIV SCREEN 4TH GENERATION: NONREACTIVE

## 2017-12-27 LAB — GLUCOSE TOLERANCE, 2 HOURS W/ 1HR
GLUCOSE, FASTING: 71 mg/dL (ref 65–91)
Glucose, 1 hour: 127 mg/dL (ref 65–179)
Glucose, 2 hour: 91 mg/dL (ref 65–152)

## 2018-01-14 ENCOUNTER — Encounter: Payer: Self-pay | Admitting: Obstetrics and Gynecology

## 2018-01-14 ENCOUNTER — Encounter: Payer: Medicaid Other | Admitting: Obstetrics and Gynecology

## 2018-01-15 NOTE — Progress Notes (Signed)
Patient did not keep return OB appointment for 01/14/2018.  Durene Romans MD Attending Center for [facility] [employer])

## 2018-01-19 LAB — HEMOGLOBIN A1C
ESTIMATED AVERAGE GLUCOSE: 103 mg/dL
HEMOGLOBIN A1C: 5.2 % (ref 4.8–5.6)

## 2018-01-19 LAB — SPECIMEN STATUS REPORT

## 2018-02-11 ENCOUNTER — Ambulatory Visit: Payer: Medicaid Other | Admitting: Obstetrics and Gynecology

## 2018-02-11 DIAGNOSIS — Q513 Bicornate uterus: Secondary | ICD-10-CM

## 2018-02-11 DIAGNOSIS — Z8751 Personal history of pre-term labor: Secondary | ICD-10-CM

## 2018-02-11 DIAGNOSIS — O09299 Supervision of pregnancy with other poor reproductive or obstetric history, unspecified trimester: Secondary | ICD-10-CM

## 2018-02-11 DIAGNOSIS — O0992 Supervision of high risk pregnancy, unspecified, second trimester: Secondary | ICD-10-CM

## 2018-02-11 DIAGNOSIS — Z789 Other specified health status: Secondary | ICD-10-CM

## 2018-02-11 DIAGNOSIS — O1213 Gestational proteinuria, third trimester: Secondary | ICD-10-CM

## 2018-02-11 NOTE — Progress Notes (Signed)
Patient not seen due to lack of interpretor, no family/friend available as well, her appointment has been rescheduled for later today.

## 2018-02-12 ENCOUNTER — Other Ambulatory Visit (HOSPITAL_COMMUNITY)
Admission: RE | Admit: 2018-02-12 | Discharge: 2018-02-12 | Disposition: A | Discharge status: Home / Self Care | Payer: Managed Care, Other (non HMO) | Source: Ambulatory Visit | Attending: Obstetrics & Gynecology | Admitting: Obstetrics & Gynecology

## 2018-02-12 ENCOUNTER — Ambulatory Visit (INDEPENDENT_AMBULATORY_CARE_PROVIDER_SITE_OTHER): Payer: Medicaid Other | Admitting: Obstetrics & Gynecology

## 2018-02-12 VITALS — BP 114/72 | HR 84 | Wt 215.0 lb

## 2018-02-12 DIAGNOSIS — O09299 Supervision of pregnancy with other poor reproductive or obstetric history, unspecified trimester: Secondary | ICD-10-CM

## 2018-02-12 DIAGNOSIS — Z3A37 37 weeks gestation of pregnancy: Secondary | ICD-10-CM | POA: Insufficient documentation

## 2018-02-12 DIAGNOSIS — O99210 Obesity complicating pregnancy, unspecified trimester: Secondary | ICD-10-CM

## 2018-02-12 DIAGNOSIS — O0992 Supervision of high risk pregnancy, unspecified, second trimester: Secondary | ICD-10-CM

## 2018-02-12 DIAGNOSIS — Z862 Personal history of diseases of the blood and blood-forming organs and certain disorders involving the immune mechanism: Secondary | ICD-10-CM | POA: Diagnosis not present

## 2018-02-12 DIAGNOSIS — Z8759 Personal history of other complications of pregnancy, childbirth and the puerperium: Secondary | ICD-10-CM

## 2018-02-12 DIAGNOSIS — O0993 Supervision of high risk pregnancy, unspecified, third trimester: Secondary | ICD-10-CM | POA: Diagnosis present

## 2018-02-12 DIAGNOSIS — Z8751 Personal history of pre-term labor: Secondary | ICD-10-CM

## 2018-02-12 DIAGNOSIS — O0930 Supervision of pregnancy with insufficient antenatal care, unspecified trimester: Secondary | ICD-10-CM | POA: Diagnosis not present

## 2018-02-12 DIAGNOSIS — Q513 Bicornate uterus: Secondary | ICD-10-CM

## 2018-02-12 NOTE — Progress Notes (Signed)
   PRENATAL VISIT NOTE  Subjective:  Stephanie Holmes is a 27 y.o. G3P1102 at [redacted]w[redacted]d being seen today for ongoing prenatal care.  She is currently monitored for the following issues for this high-risk pregnancy and has Late prenatal care; Supervision of high risk pregnancy in second trimester; History of pre-eclampsia in prior pregnancy, currently pregnant; History of preterm labor; History of postpartum hemorrhage; Bicornuate uterus; Language barrier; Obesity in pregnancy; and Proteinuria affecting pregnancy on their problem list.  Patient reports no complaints.  Contractions: Irritability. Vag. Bleeding: None.  Movement: Present. Denies leaking of fluid.   The following portions of the patient's history were reviewed and updated as appropriate: allergies, current medications, past family history, past medical history, past social history, past surgical history and problem list. Problem list updated.  Objective:   Vitals:   02/12/18 1527  BP: 114/72  Pulse: 84  Weight: 215 lb (97.5 kg)    Fetal Status: Fetal Heart Rate (bpm): 155   Movement: Present     General:  Alert, oriented and cooperative. Patient is in no acute distress.  Skin: Skin is warm and dry. No rash noted.   Cardiovascular: Normal heart rate noted  Respiratory: Normal respiratory effort, no problems with respiration noted  Abdomen: Soft, gravid, appropriate for gestational age.  Pain/Pressure: Present     Pelvic: Cervical exam performed        Extremities: Normal range of motion.  Edema: None  Mental Status: Normal mood and affect. Normal behavior. Normal judgment and thought content.   Assessment and Plan:  Pregnancy: G3P1102 at [redacted]w[redacted]d  1. Supervision of high risk pregnancy in second trimester Pt did not have an interpreter with her. She speak limited [place]. An appropriate interpreter could not be found on [place].   - Culture, beta strep (group b only) - GC/Chlamydia probe amp ([place])not at  [facility]  2. Obesity in pregnancy  3. Late prenatal care  4. History of postpartum hemorrhage  5. History of pre-eclampsia in prior pregnancy, currently pregnant  6. History of preterm labor  7. Bicornuate uterus  Term labor symptoms and general obstetric precautions including but not limited to vaginal bleeding, contractions, leaking of fluid and fetal movement were reviewed in detail with the patient. Please refer to After Visit Summary for other counseling recommendations.  Return in about 1 week (around 02/19/2018).  No future appointments.  Lavonia Drafts, MD

## 2018-02-13 LAB — GC/CHLAMYDIA PROBE AMP (~~LOC~~) NOT AT ARMC
CHLAMYDIA, DNA PROBE: NEGATIVE
NEISSERIA GONORRHEA: NEGATIVE

## 2018-02-15 ENCOUNTER — Encounter (HOSPITAL_COMMUNITY): Payer: Self-pay

## 2018-02-15 ENCOUNTER — Inpatient Hospital Stay (HOSPITAL_COMMUNITY)
Admission: AD | Admit: 2018-02-15 | Discharge: 2018-02-17 | DRG: 807 | Disposition: A | Discharge status: Home / Self Care | Payer: Managed Care, Other (non HMO) | Attending: Obstetrics & Gynecology | Admitting: Obstetrics & Gynecology

## 2018-02-15 DIAGNOSIS — Q513 Bicornate uterus: Secondary | ICD-10-CM | POA: Diagnosis not present

## 2018-02-15 DIAGNOSIS — O4292 Full-term premature rupture of membranes, unspecified as to length of time between rupture and onset of labor: Secondary | ICD-10-CM | POA: Diagnosis present

## 2018-02-15 DIAGNOSIS — O42919 Preterm premature rupture of membranes, unspecified as to length of time between rupture and onset of labor, unspecified trimester: Secondary | ICD-10-CM

## 2018-02-15 DIAGNOSIS — Q512 Other doubling of uterus, unspecified: Secondary | ICD-10-CM | POA: Diagnosis not present

## 2018-02-15 DIAGNOSIS — Z3A37 37 weeks gestation of pregnancy: Secondary | ICD-10-CM | POA: Diagnosis not present

## 2018-02-15 DIAGNOSIS — O3403 Maternal care for unspecified congenital malformation of uterus, third trimester: Secondary | ICD-10-CM | POA: Diagnosis present

## 2018-02-15 DIAGNOSIS — O99824 Streptococcus B carrier state complicating childbirth: Secondary | ICD-10-CM | POA: Diagnosis present

## 2018-02-15 DIAGNOSIS — Z8751 Personal history of pre-term labor: Secondary | ICD-10-CM

## 2018-02-15 DIAGNOSIS — O0930 Supervision of pregnancy with insufficient antenatal care, unspecified trimester: Secondary | ICD-10-CM

## 2018-02-15 DIAGNOSIS — O4202 Full-term premature rupture of membranes, onset of labor within 24 hours of rupture: Secondary | ICD-10-CM | POA: Diagnosis not present

## 2018-02-15 LAB — TYPE AND SCREEN
ABO/RH(D): A POS
ANTIBODY SCREEN: NEGATIVE

## 2018-02-15 LAB — CBC
HCT: 37.4 % (ref 36.0–46.0)
Hemoglobin: 11.9 g/dL — ABNORMAL LOW (ref 12.0–15.0)
MCH: 24.9 pg — ABNORMAL LOW (ref 26.0–34.0)
MCHC: 31.8 g/dL (ref 30.0–36.0)
MCV: 78.2 fL (ref 78.0–100.0)
PLATELETS: 230 10*3/uL (ref 150–400)
RBC: 4.78 MIL/uL (ref 3.87–5.11)
RDW: 14.9 % (ref 11.5–15.5)
WBC: 8.7 10*3/uL (ref 4.0–10.5)

## 2018-02-15 LAB — POCT FERN TEST: POCT Fern Test: POSITIVE

## 2018-02-15 LAB — CULTURE, BETA STREP (GROUP B ONLY): STREP GP B CULTURE: POSITIVE — AB

## 2018-02-15 MED ORDER — OXYTOCIN 40 UNITS IN LACTATED RINGERS INFUSION - SIMPLE MED
2.5000 [IU]/h | INTRAVENOUS | Status: DC
  Filled 2018-02-15: qty 1000

## 2018-02-15 MED ORDER — LACTATED RINGERS IV SOLN: 500.0000 mL | INTRAVENOUS | Status: DC | PRN

## 2018-02-15 MED ORDER — SOD CITRATE-CITRIC ACID 500-334 MG/5ML PO SOLN: 30.0000 mL | ORAL | Status: DC | PRN

## 2018-02-15 MED ORDER — SODIUM CHLORIDE 0.9 % IV SOLN
5.0000 10*6.[IU] | Freq: Once | INTRAVENOUS | Status: AC
  Administered 2018-02-15: 5 10*6.[IU] via INTRAVENOUS
  Filled 2018-02-15: qty 5

## 2018-02-15 MED ORDER — OXYTOCIN BOLUS FROM INFUSION
500.0000 mL | Freq: Once | INTRAVENOUS | Status: AC
  Administered 2018-02-16: 500 mL via INTRAVENOUS

## 2018-02-15 MED ORDER — ACETAMINOPHEN 325 MG PO TABS: 650.0000 mg | ORAL_TABLET | ORAL | Status: DC | PRN

## 2018-02-15 MED ORDER — LACTATED RINGERS IV SOLN
INTRAVENOUS | Status: DC
Start: 2018-02-15 — End: 2018-02-16
  Administered 2018-02-15: 21:00:00 via INTRAVENOUS

## 2018-02-15 MED ORDER — OXYCODONE-ACETAMINOPHEN 5-325 MG PO TABS: 2.0000 | ORAL_TABLET | ORAL | Status: DC | PRN

## 2018-02-15 MED ORDER — OXYCODONE-ACETAMINOPHEN 5-325 MG PO TABS: 1.0000 | ORAL_TABLET | ORAL | Status: DC | PRN

## 2018-02-15 MED ORDER — ONDANSETRON HCL 4 MG/2ML IJ SOLN: 4.0000 mg | Freq: Four times a day (QID) | INTRAMUSCULAR | Status: DC | PRN

## 2018-02-15 MED ORDER — PENICILLIN G POT IN DEXTROSE 60000 UNIT/ML IV SOLN
3.0000 10*6.[IU] | INTRAVENOUS | Status: DC
  Administered 2018-02-16: 3 10*6.[IU] via INTRAVENOUS
  Filled 2018-02-15 (×4): qty 50

## 2018-02-15 MED ORDER — LIDOCAINE HCL (PF) 1 % IJ SOLN
30.0000 mL | INTRAMUSCULAR | Status: DC | PRN
  Filled 2018-02-15: qty 30

## 2018-02-15 NOTE — H&P (Signed)
Stephanie Holmes is a 27 y.o. 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 She has hx preterm delivery x 1, preeclampsia, and postpartum hemorrhage. This pregnancy has been uncomplicated.    Nursing Staff Provider  Office Location  [facility] Dating   LMP c/w late 2nd trimester [place]  Language  [place]ian Anatomy US   Normal  Flu Vaccine   Genetic Screen  NIPS low risk   TDaP vaccine  12/26/17  Hgb A1C or  GTT Early too late Third trimester: normal  Rhogam   N/A   LAB RESULTS   Feeding Plan Breast/Bottle Blood Type A/Positive/-- (05/10 1008)   Contraception unsure Antibody Negative (05/10 1008)  Circumcision no Rubella 18.20 (05/10 1008)  Pediatrician  unsure RPR Non Reactive (05/10 1008)   Support Person Thomasene Lot (husband) HBsAg Negative (05/10 1008)   Prenatal Classes  HIV Non Reactive (05/10 1008)  BTL Consent  GBS  (For PCN allergy, check sensitivities)   VBAC Consent  Pap     Hgb Electro      CF     SMA     Waterbirth  [ ]  Class [ ]  Consent [ ]  CNM visit    OB History    Gravida  3   Para  2   Term  1   Preterm  1   AB      Living  2     SAB      TAB      Ectopic      Multiple      Live Births  1          Past Medical History:  Diagnosis Date  . Medical history non-contributory   . Preterm labor    Past Surgical History:  Procedure Laterality Date  . NO PAST SURGERIES     Family History: family history is not on file. Social History:  reports that she has never smoked. She has never used smokeless tobacco. She reports that she does not drink alcohol or use drugs.     Maternal Diabetes: No Genetic Screening: Normal Maternal Ultrasounds/Referrals: Normal Fetal Ultrasounds or other Referrals:  None Maternal Substance Abuse:  No Significant Maternal Medications:  None Significant Maternal Lab Results:  Lab values include: Group B Strep positive Other Comments:  None  Review of Systems   Constitutional: Negative for chills, fever and malaise/fatigue.  Eyes: Negative for blurred vision.  Respiratory: Negative for cough and shortness of breath.   Cardiovascular: Negative for chest pain.  Gastrointestinal: Negative for abdominal pain, heartburn and vomiting.  Genitourinary: Negative for dysuria, frequency and urgency.  Musculoskeletal: Negative.   Neurological: Negative for dizziness and headaches.  Psychiatric/Behavioral: Negative for depression.   Maternal Medical History:  Reason for admission: Rupture of membranes.   Contractions: Frequency: rare.    Fetal activity: Perceived fetal activity is normal.    Prenatal complications: no prenatal complications Prenatal Complications - Diabetes: none.      Blood pressure 123/76, pulse 98, temperature 98.2 F (36.8 C), temperature source Oral, resp. rate 16, weight 222 lb (100.7 kg), last menstrual period 05/26/2017. Maternal Exam:  Uterine Assessment: Contraction strength is mild.  Contraction frequency is rare.   Abdomen: Fetal presentation: vertex     Fetal Exam Fetal Monitor Review: Mode: ultrasound.   Baseline rate: 140.  Variability: moderate (6-25 bpm).   Pattern: accelerations present and no decelerations.    Fetal State Assessment: Category I -  tracings are normal.     Physical Exam  Nursing note and vitals reviewed. Constitutional: She is oriented to person, place, and time. She appears well-developed and well-nourished.  Neck: Normal range of motion.  Cardiovascular: Normal rate, regular rhythm and normal heart sounds.  Respiratory: Effort normal and breath sounds normal.  GI: Soft.  Musculoskeletal: Normal range of motion.  Neurological: She is alert and oriented to person, place, and time.  Skin: Skin is warm and dry.  Psychiatric: She has a normal mood and affect. Her behavior is normal. Judgment and thought content normal.    Prenatal labs: ABO, Rh: A/Positive/-- (05/10  1008) Antibody: Negative (05/10 1008) Rubella: 18.20 (05/10 1008) RPR: Non Reactive (06/07 0832)  HBsAg: Negative (05/10 1008)  HIV: Non Reactive (06/07 0832)  GBS:   Positive  Assessment/Plan: [id number] @[redacted]w[redacted]d  by second trimester [place] PROM > 24 hours GBS Positive  Admit to [facility] for GBS prophylaxis Augmentation with Pitocin if no evidence of active labor Anticipate NSVD  Fatima Blank 02/15/2018, 8:25 PM

## 2018-02-15 NOTE — Progress Notes (Signed)
Per pt, SROM 02/14/18 at approximately 1500, clear fluids

## 2018-02-15 NOTE — Progress Notes (Signed)
Pt's first language [place]ohn[place]ian, speaks some [place]. [place] interpreters called for admission, no translators currently available. Contacted Denyse Amass (house coverage), proceeded with admission with help from FOB.

## 2018-02-15 NOTE — MAU Note (Signed)
Pt reports SROM 1800

## 2018-02-16 ENCOUNTER — Encounter (HOSPITAL_COMMUNITY): Payer: Self-pay

## 2018-02-16 ENCOUNTER — Encounter: Payer: Self-pay | Admitting: Obstetrics & Gynecology

## 2018-02-16 DIAGNOSIS — O4202 Full-term premature rupture of membranes, onset of labor within 24 hours of rupture: Secondary | ICD-10-CM

## 2018-02-16 DIAGNOSIS — O99820 Streptococcus B carrier state complicating pregnancy: Secondary | ICD-10-CM

## 2018-02-16 DIAGNOSIS — Z3A37 37 weeks gestation of pregnancy: Secondary | ICD-10-CM

## 2018-02-16 DIAGNOSIS — O99824 Streptococcus B carrier state complicating childbirth: Secondary | ICD-10-CM

## 2018-02-16 HISTORY — DX: Streptococcus B carrier state complicating pregnancy: O99.820

## 2018-02-16 LAB — CBC
HCT: 34 % — ABNORMAL LOW (ref 36.0–46.0)
Hemoglobin: 10.9 g/dL — ABNORMAL LOW (ref 12.0–15.0)
MCH: 24.8 pg — ABNORMAL LOW (ref 26.0–34.0)
MCHC: 32.1 g/dL (ref 30.0–36.0)
MCV: 77.3 fL — AB (ref 78.0–100.0)
PLATELETS: 206 10*3/uL (ref 150–400)
RBC: 4.4 MIL/uL (ref 3.87–5.11)
RDW: 14.8 % (ref 11.5–15.5)
WBC: 14.4 10*3/uL — AB (ref 4.0–10.5)

## 2018-02-16 LAB — RPR: RPR Ser Ql: NONREACTIVE

## 2018-02-16 MED ORDER — IBUPROFEN 600 MG PO TABS
600.0000 mg | ORAL_TABLET | Freq: Four times a day (QID) | ORAL | Status: DC
  Administered 2018-02-16 – 2018-02-17 (×6): 600 mg via ORAL
  Filled 2018-02-16 (×6): qty 1

## 2018-02-16 MED ORDER — SENNOSIDES-DOCUSATE SODIUM 8.6-50 MG PO TABS
2.0000 | ORAL_TABLET | ORAL | Status: DC
  Administered 2018-02-16: 2 via ORAL
  Filled 2018-02-16: qty 2

## 2018-02-16 MED ORDER — OXYCODONE HCL 5 MG PO TABS: 5.0000 mg | ORAL_TABLET | ORAL | Status: DC | PRN

## 2018-02-16 MED ORDER — ONDANSETRON HCL 4 MG/2ML IJ SOLN
4.0000 mg | INTRAMUSCULAR | Status: DC | PRN
Start: 2018-02-16 — End: 2018-02-17

## 2018-02-16 MED ORDER — DIBUCAINE 1 % RE OINT
1.0000 "application " | TOPICAL_OINTMENT | RECTAL | Status: DC | PRN
  Filled 2018-02-16: qty 28

## 2018-02-16 MED ORDER — ONDANSETRON HCL 4 MG PO TABS: 4.0000 mg | ORAL_TABLET | ORAL | Status: DC | PRN

## 2018-02-16 MED ORDER — TETANUS-DIPHTH-ACELL PERTUSSIS 5-2.5-18.5 LF-MCG/0.5 IM SUSP: 0.5000 mL | Freq: Once | INTRAMUSCULAR | Status: DC

## 2018-02-16 MED ORDER — MISOPROSTOL 200 MCG PO TABS
ORAL_TABLET | ORAL | Status: AC
  Filled 2018-02-16: qty 5

## 2018-02-16 MED ORDER — SIMETHICONE 80 MG PO CHEW: 80.0000 mg | CHEWABLE_TABLET | ORAL | Status: DC | PRN

## 2018-02-16 MED ORDER — DIPHENHYDRAMINE HCL 25 MG PO CAPS
25.0000 mg | ORAL_CAPSULE | Freq: Four times a day (QID) | ORAL | Status: DC | PRN
Start: 2018-02-16 — End: 2018-02-17

## 2018-02-16 MED ORDER — FENTANYL CITRATE (PF) 100 MCG/2ML IJ SOLN
INTRAMUSCULAR | Status: AC
  Filled 2018-02-16: qty 2

## 2018-02-16 MED ORDER — TRANEXAMIC ACID 1000 MG/10ML IV SOLN
INTRAVENOUS | Status: AC
  Filled 2018-02-16: qty 10

## 2018-02-16 MED ORDER — ZOLPIDEM TARTRATE 5 MG PO TABS: 5.0000 mg | ORAL_TABLET | Freq: Every evening | ORAL | Status: DC | PRN

## 2018-02-16 MED ORDER — COCONUT OIL OIL
1.0000 "application " | TOPICAL_OIL | Status: DC | PRN
  Filled 2018-02-16: qty 120

## 2018-02-16 MED ORDER — WITCH HAZEL-GLYCERIN EX PADS: 1.0000 "application " | MEDICATED_PAD | CUTANEOUS | Status: DC | PRN

## 2018-02-16 MED ORDER — BENZOCAINE-MENTHOL 20-0.5 % EX AERO
1.0000 "application " | INHALATION_SPRAY | CUTANEOUS | Status: DC | PRN
  Filled 2018-02-16: qty 56

## 2018-02-16 MED ORDER — PRENATAL MULTIVITAMIN CH
1.0000 | ORAL_TABLET | Freq: Every day | ORAL | Status: DC
  Administered 2018-02-16 – 2018-02-17 (×2): 1 via ORAL
  Filled 2018-02-16 (×2): qty 1

## 2018-02-16 MED ORDER — OXYCODONE HCL 5 MG PO TABS: 10.0000 mg | ORAL_TABLET | ORAL | Status: DC | PRN

## 2018-02-16 MED ORDER — ACETAMINOPHEN 325 MG PO TABS: 650.0000 mg | ORAL_TABLET | ORAL | Status: DC | PRN

## 2018-02-16 MED ORDER — FENTANYL CITRATE (PF) 100 MCG/2ML IJ SOLN
100.0000 ug | INTRAMUSCULAR | Status: DC | PRN
  Administered 2018-02-16: 100 ug via INTRAVENOUS

## 2018-02-16 MED ORDER — METHYLERGONOVINE MALEATE 0.2 MG/ML IJ SOLN
INTRAMUSCULAR | Status: AC
  Filled 2018-02-16: qty 1

## 2018-02-16 NOTE — Progress Notes (Signed)
Lakea Holmes is a 27 y.o. G3P1102 at [redacted]w[redacted]d by ultrasound admitted for rupture of membranes  Subjective: Pt is feeling some abdominal cramping/contractions, mild but becoming more painful.  Objective: BP 121/62   Pulse 89   Temp 98.1 F (36.7 C) (Oral)   Resp 18   Wt 222 lb (100.7 kg)   LMP 05/26/2017 (Approximate)   BMI 36.94 kg/m  No intake/output data recorded. No intake/output data recorded.  FHT:  FHR: 135 bpm, variability: moderate,  accelerations:  Present,  decelerations:  Absent UC:   Irregular, palpate Q 10 minutes, toco readjusted for improved tracing  SVE: 6/80/-2, vertex     Labs: Lab Results  Component Value Date   WBC 8.7 02/15/2018   HGB 11.9 (L) 02/15/2018   HCT 37.4 02/15/2018   MCV 78.2 02/15/2018   PLT 230 02/15/2018    Assessment / Plan: Spontaneous labor, progressing normally  Labor: Progressing normally and will continue expectant management at this time, consider augmentation if labor slows Preeclampsia:  n/a Fetal Wellbeing:  Category I Pain Control:  Labor support without medications I/D:  GBS positive on PCN Anticipated MOD:  NSVD  Stephanie Holmes 02/16/2018, 12:07 AM

## 2018-02-16 NOTE — Progress Notes (Signed)
Patient speaks limited [place]; priniciple language is [place]ian, for which there is currently no interpreter available via [place] or language line. Several attempts were made by this nurse and charge nurse Armen Pickup to acquire an interpreter through language line. Apparently [facility] was also unable to find an interpreter for the birth, per L&D nurse.    Patient has filled out birth certificate (L&D); understood about the security features we have in place for baby and family; knows how to use bulb syringe; consented to baby having vitamin B shot.  Patient was able to get up and void, with assist, in M/B.  Knows not to sleep with baby; is experienced breastfeeding mom.  Family had another child here 4-5 years ago.

## 2018-02-16 NOTE — Progress Notes (Addendum)
Post Partum Day 1 Subjective: no complaints, up ad lib, voiding, tolerating PO, + flatus and no headache, RUQ pain or Visual changes   Objective: Blood pressure 104/72, pulse 68, temperature 98.3 F (36.8 C), temperature source Oral, resp. rate 18, weight 100.7 kg (222 lb), last menstrual period 05/26/2017, SpO2 95 %, unknown if currently breastfeeding.  Physical Exam:  General: alert and cooperative Lochia: appropriate Uterine Fundus: firm Incision: NA DVT Evaluation: No evidence of DVT seen on physical exam. No cords or calf tenderness.  Recent Labs    02/15/18 2042 02/16/18 0702  HGB 11.9* 10.9*  HCT 37.4 34.0*    Assessment/Plan: Plan for discharge tomorrow   LOS: 1 day   Justus Memory 02/16/2018, 9:26 AM   I confirm that I have verified the information documented in the resident's note and that I have also personally reperformed the physical exam and all medical decision making activities. Patient was seen and examined by me also Agree with note Vitals stable Labs stable Fundus firm, lochia within normal limits Perineum healing Ext WNL Continue care Anticipate discharge tomorrow  Seabron Spates, CNM

## 2018-02-16 NOTE — Lactation Note (Signed)
This note was copied from a baby's chart. Lactation Consultation Note  Patient Name: Stephanie Holmes LDJTT'S Date: 02/16/2018 Reason for consult: Initial assessment;Early term 37-38.6wks;Other (Comment)(used [place] PACIFIC INTERPRETER -ID # [id number])  Baby is 72 hours old [place] reviewed and updated the doc flow sheets per yellow feeding sheet.  Also while LC in the room , baby had a wet diaper, and was already latched with depth on the left breast / swallows  Noted, per mom comfortable except cramping. And LC assisted to latch on the right breast / football/ with depth and  Few swallows. Mom reports cramping with both latches.  Mom able to hand express without assistance.  Per mom not active with WIC.  Mother informed of post-discharge support and given phone number to the lactation department, including services for phone call assistance; out-patient appointments; and breastfeeding support group. List of other breastfeeding resources in the community given in the handout. Encouraged mother to call for problems or concerns related to breastfeeding.   Maternal Data Has patient been taught Hand Expression?: Yes(mom able to hand express independently and needed very little instructions ) Does the patient have breastfeeding experience prior to this delivery?: Yes  Feeding Feeding Type: Breast Fed Length of feed: 8 min(SWALLOWS NOTED )  LATCH Score Latch: Grasps breast easily, tongue down, lips flanged, rhythmical sucking.  Audible Swallowing: A few with stimulation  Type of Nipple: Everted at rest and after stimulation  Comfort (Breast/Nipple): Soft / non-tender  Hold (Positioning): Assistance needed to correctly position infant at breast and maintain latch.  LATCH Score: 8  Interventions Interventions: Breast feeding basics reviewed  Lactation Tools Discussed/Used WIC Program: No(NO PER MOM )   Consult Status Consult Status: Follow-up Date: 02/17/18 Follow-up type:  In-patient    [place] 02/16/2018, 3:20 PM

## 2018-02-17 MED ORDER — IBUPROFEN 600 MG PO TABS: 600.0000 mg | ORAL_TABLET | Freq: Four times a day (QID) | ORAL | 2 refills | Status: DC | PRN

## 2018-02-17 NOTE — Progress Notes (Signed)
CSW received consult due to score 16 on Edinburgh Depression Screen.    When CSW arrived, MOB was bonding with infant as evidence by engaging in skin to skin.  FOB was also present and assisted with interpreting due to language barrier.   CSW reviewed MOB's EDPS results and MOB communicated that MOB and FOB misunderstood the questions due to language barrier. MOB denied any feelings of sadness, being overwhelmed, or feeling panicky.   CSW provided education regarding Baby Blues vs PMADs.  CSW encouraged MOB to evaluate her mental health throughout the postpartum period with the use of the New Mom Checklist developed by Postpartum Progress and notify a medical professional if symptoms arise.  CSW assessed for safety and MOB denied SI and HI. MOB reported having all necessary items for infant and having a good support team.  There are no barriers to discharge.   Laurey Arrow, MSW, LCSW Clinical Social Work [phone]

## 2018-02-17 NOTE — Discharge Instructions (Signed)
Vaginal Delivery, Care After °Refer to this sheet in the next few weeks. These instructions provide you with information about caring for yourself after vaginal delivery. Your health care provider may also give you more specific instructions. Your treatment has been planned according to current medical practices, but problems sometimes occur. Call your health care provider if you have any problems or questions. °What can I expect after the procedure? °After vaginal delivery, it is common to have: °· Some bleeding from your vagina. °· Soreness in your abdomen, your vagina, and the area of skin between your vaginal opening and your anus (perineum). °· Pelvic cramps. °· Fatigue. ° °Follow these instructions at home: °Medicines °· Take over-the-counter and prescription medicines only as told by your health care provider. °· If you were prescribed an antibiotic medicine, take it as told by your health care provider. Do not stop taking the antibiotic until it is finished. °Driving ° °· Do not drive or operate heavy machinery while taking prescription pain medicine. °· Do not drive for 24 hours if you received a sedative. °Lifestyle °· Do not drink alcohol. This is especially important if you are breastfeeding or taking medicine to relieve pain. °· Do not use tobacco products, including cigarettes, chewing tobacco, or e-cigarettes. If you need help quitting, ask your health care provider. °Eating and drinking °· Drink at least 8 eight-ounce glasses of water every day unless you are told not to by your health care provider. If you choose to breastfeed your baby, you may need to drink more water than this. °· Eat high-fiber foods every day. These foods may help prevent or relieve constipation. High-fiber foods include: °? Whole grain cereals and breads. °? Brown rice. °? Beans. °? Fresh fruits and vegetables. °Activity °· Return to your normal activities as told by your health care provider. Ask your health care provider  what activities are safe for you. °· Rest as much as possible. Try to rest or take a nap when your baby is sleeping. °· Do not lift anything that is heavier than your baby or 10 lb (4.5 kg) until your health care provider says that it is safe. °· Talk with your health care provider about when you can engage in sexual activity. This may depend on your: °? Risk of infection. °? Rate of healing. °? Comfort and desire to engage in sexual activity. °Vaginal Care °· If you have an episiotomy or a vaginal tear, check the area every day for signs of infection. Check for: °? More redness, swelling, or pain. °? More fluid or blood. °? Warmth. °? Pus or a bad smell. °· Do not use tampons or douches until your health care provider says this is safe. °· Watch for any blood clots that may pass from your vagina. These may look like clumps of dark red, brown, or black discharge. °General instructions °· Keep your perineum clean and dry as told by your health care provider. °· Wear loose, comfortable clothing. °· Wipe from front to back when you use the toilet. °· Ask your health care provider if you can shower or take a bath. If you had an episiotomy or a perineal tear during labor and delivery, your health care provider may tell you not to take baths for a certain length of time. °· Wear a bra that supports your breasts and fits you well. °· If possible, have someone help you with household activities and help care for your baby for at least a few days after   you leave the hospital. °· Keep all follow-up visits for you and your baby as told by your health care provider. This is important. °Contact a health care provider if: °· You have: °? Vaginal discharge that has a bad smell. °? Difficulty urinating. °? Pain when urinating. °? A sudden increase or decrease in the frequency of your bowel movements. °? More redness, swelling, or pain around your episiotomy or vaginal tear. °? More fluid or blood coming from your episiotomy or  vaginal tear. °? Pus or a bad smell coming from your episiotomy or vaginal tear. °? A fever. °? A rash. °? Little or no interest in activities you used to enjoy. °? Questions about caring for yourself or your baby. °· Your episiotomy or vaginal tear feels warm to the touch. °· Your episiotomy or vaginal tear is separating or does not appear to be healing. °· Your breasts are painful, hard, or turn red. °· You feel unusually sad or worried. °· You feel nauseous or you vomit. °· You pass large blood clots from your vagina. If you pass a blood clot from your vagina, save it to show to your health care provider. Do not flush blood clots down the toilet without having your health care provider look at them. °· You urinate more than usual. °· You are dizzy or light-headed. °· You have not breastfed at all and you have not had a menstrual period for 12 weeks after delivery. °· You have stopped breastfeeding and you have not had a menstrual period for 12 weeks after you stopped breastfeeding. °Get help right away if: °· You have: °? Pain that does not go away or does not get better with medicine. °? Chest pain. °? Difficulty breathing. °? Blurred vision or spots in your vision. °? Thoughts about hurting yourself or your baby. °· You develop pain in your abdomen or in one of your legs. °· You develop a severe headache. °· You faint. °· You bleed from your vagina so much that you fill two sanitary pads in one hour. °This information is not intended to replace advice given to you by your health care provider. Make sure you discuss any questions you have with your health care provider. °Document Released: 07/05/2000 Document Revised: 12/20/2015 Document Reviewed: 07/23/2015 °Elsevier Interactive Patient Education © 2018 Elsevier Inc. ° °

## 2018-02-17 NOTE — Progress Notes (Signed)
Due to high hospital census and acuity, CSW is unable to meet with MOB to complete assessment for Late [facility].  CSW notes that baby's UDS is negative and will monitor CDS result.  CSW will make report to Child Protective Services if warranted.  Please consult CSW if concerns arise or by MOB's request.  Laurey Arrow, MSW, LCSW Clinical Social Work [phone]

## 2018-02-17 NOTE — Lactation Note (Signed)
This note was copied from a baby's chart. Lactation Consultation Note  Patient Name: Stephanie Holmes ANVBT'Y Date: 02/17/2018 Reason for consult: Follow-up assessment Waited on dad to get back from [facility] to interpret.  Parents speak [place].  Unable to get interpreter via voice stratus.  Mom reports breast fullness, but wants to know what to feed the baby if she does not have enough milk.  Mom has successfully bf 3 other children for 1-2 years.  Mom able to hand express small drops of colostrum easily. Urged mom to hand express and spoon feed all EBM back to infant if she felt like she did not have enough or he was not getting enough.  Urged to give formula only if it was medically indicated. Parents have information about breastfeeding support services for when they go home.  Maternal Data    Feeding Feeding Type: Breast Fed Length of feed: 10 min  LATCH Score                   Interventions    Lactation Tools Discussed/Used     Consult Status Consult Status: Complete Date: 02/17/18 Follow-up type: Call as needed    [facility] 02/17/2018, 2:47 PM

## 2018-02-17 NOTE — Discharge Summary (Signed)
OB Discharge Summary     Patient Name: Stephanie Holmes DOB: Jan 08, 1991 MRN: [id number]  Date of admission: 02/15/2018 Delivering MD: Fatima Blank A   Date of discharge: 02/17/2018  Admitting diagnosis: 11 WKS, WATER BROKE Intrauterine pregnancy: [redacted]w[redacted]d     Secondary diagnosis:  Principal Problem:   NSVD (normal spontaneous vaginal delivery)  Additional problems: None     Discharge diagnosis: Term Pregnancy Delivered                                                                                                Post partum procedures:None  Augmentation: None  Complications: None  Hospital course:  Onset of Labor With Vaginal Delivery     27 y.o. yo [id number] at [redacted]w[redacted]d was admitted in Latent Labor/Prolonged PROM on 02/15/2018. Patient had an uncomplicated labor course as follows:  Membrane Rupture Time/Date: 3:00 PM ,02/14/2018   Intrapartum Procedures: Episiotomy: None [1]                                         Lacerations:  None [1]  Patient had a delivery of a Viable infant. 02/16/2018  Information for the patient's newborn:  Shantana, Christon [[id number]]  Delivery Method: Vaginal, Spontaneous(Filed from Delivery Summary)    Pateint had an uncomplicated postpartum course.  She is ambulating, tolerating a regular diet, passing flatus, and urinating well. Patient is discharged home in stable condition on 02/17/18.   Physical exam  Vitals:   02/16/18 1445 02/16/18 1845 02/16/18 2228 02/17/18 0524  BP: 113/66 116/62 119/73 106/78  Pulse: 72 69 92 66  Resp: 18 16 16 20   Temp: (!) 97.3 F (36.3 C) 98.2 F (36.8 C) 98 F (36.7 C) 97.7 F (36.5 C)  TempSrc: Oral Oral Oral Axillary  SpO2:      Weight:       General: alert, cooperative and no distress Lochia: appropriate Uterine Fundus: firm DVT Evaluation: No evidence of DVT seen on physical exam. Negative Homan's sign. No cords or calf tenderness. Labs: Lab Results  Component Value Date   WBC 14.4 (H) 02/16/2018   HGB 10.9 (L) 02/16/2018   HCT 34.0 (L) 02/16/2018   MCV 77.3 (L) 02/16/2018   PLT 206 02/16/2018   CMP Latest Ref Rng & Units 12/12/2017  Glucose 65 - 99 mg/dL 95  BUN 6 - 20 mg/dL 6  Creatinine 0.57 - 1.00 mg/dL 0.50(L)  Sodium 134 - 144 mmol/L 136  Potassium 3.5 - 5.2 mmol/L 3.9  Chloride 96 - 106 mmol/L 103  CO2 20 - 29 mmol/L 21  Calcium 8.7 - 10.2 mg/dL 9.0  Total Protein 6.0 - 8.5 g/dL 6.8  Total Bilirubin 0.0 - 1.2 mg/dL <0.2  Alkaline Phos 39 - 117 IU/L 74  AST 0 - 40 IU/L 19  ALT 0 - 32 IU/L 19    Discharge instruction: per After Visit Summary and "Baby and Me Booklet".  After visit meds:  Allergies as of 02/17/2018   No Known Allergies  Medication List    TAKE these medications   ibuprofen 600 MG tablet Commonly known as:  ADVIL,MOTRIN Take 1 tablet (600 mg total) by mouth every 6 (six) hours as needed for moderate pain or cramping.       Diet: routine diet  Activity: Advance as tolerated. Pelvic rest for 6 weeks.   Outpatient follow up:4 weeks  Postpartum contraception: Depo Provera  Newborn Data: Live born female  Birth Weight: 6 lb 10 oz (3005 g) APGAR: 8, 9  Newborn Delivery   Birth date/time:  02/16/2018 02:31:00 Delivery type:  Vaginal, Spontaneous     Baby Feeding: Breast Disposition:home with mother   02/17/2018 Verita Schneiders, MD

## 2018-02-23 ENCOUNTER — Encounter: Payer: Medicaid Other | Admitting: Family Medicine

## 2018-03-02 ENCOUNTER — Encounter: Payer: Medicaid Other | Admitting: Family Medicine

## 2018-03-17 ENCOUNTER — Encounter: Payer: Self-pay | Admitting: Family Medicine

## 2018-03-17 ENCOUNTER — Ambulatory Visit: Payer: Medicaid Other | Admitting: Medical

## 2018-04-16 ENCOUNTER — Encounter: Payer: Self-pay | Admitting: *Deleted

## 2019-06-23 ENCOUNTER — Inpatient Hospital Stay (HOSPITAL_BASED_OUTPATIENT_CLINIC_OR_DEPARTMENT_OTHER): Payer: Managed Care, Other (non HMO)

## 2019-06-23 ENCOUNTER — Encounter (HOSPITAL_COMMUNITY): Payer: Self-pay | Admitting: *Deleted

## 2019-06-23 ENCOUNTER — Inpatient Hospital Stay (HOSPITAL_COMMUNITY)
Admission: AD | Admit: 2019-06-23 | Discharge: 2019-06-23 | Disposition: A | Discharge status: Home / Self Care | Payer: Managed Care, Other (non HMO) | Attending: Obstetrics & Gynecology | Admitting: Obstetrics & Gynecology

## 2019-06-23 ENCOUNTER — Other Ambulatory Visit: Payer: Self-pay

## 2019-06-23 DIAGNOSIS — E669 Obesity, unspecified: Secondary | ICD-10-CM

## 2019-06-23 DIAGNOSIS — R1030 Lower abdominal pain, unspecified: Secondary | ICD-10-CM | POA: Diagnosis present

## 2019-06-23 DIAGNOSIS — R109 Unspecified abdominal pain: Secondary | ICD-10-CM

## 2019-06-23 DIAGNOSIS — Z3A24 24 weeks gestation of pregnancy: Secondary | ICD-10-CM

## 2019-06-23 DIAGNOSIS — O3402 Maternal care for unspecified congenital malformation of uterus, second trimester: Secondary | ICD-10-CM | POA: Insufficient documentation

## 2019-06-23 DIAGNOSIS — O99212 Obesity complicating pregnancy, second trimester: Secondary | ICD-10-CM | POA: Diagnosis not present

## 2019-06-23 DIAGNOSIS — O26892 Other specified pregnancy related conditions, second trimester: Secondary | ICD-10-CM

## 2019-06-23 DIAGNOSIS — O0932 Supervision of pregnancy with insufficient antenatal care, second trimester: Secondary | ICD-10-CM | POA: Diagnosis not present

## 2019-06-23 DIAGNOSIS — O09892 Supervision of other high risk pregnancies, second trimester: Secondary | ICD-10-CM

## 2019-06-23 DIAGNOSIS — Q513 Bicornate uterus: Secondary | ICD-10-CM | POA: Diagnosis not present

## 2019-06-23 DIAGNOSIS — O09212 Supervision of pregnancy with history of pre-term labor, second trimester: Secondary | ICD-10-CM

## 2019-06-23 LAB — URINALYSIS, ROUTINE W REFLEX MICROSCOPIC
Bacteria, UA: NONE SEEN
Bilirubin Urine: NEGATIVE
Glucose, UA: NEGATIVE mg/dL
Ketones, ur: NEGATIVE mg/dL
Leukocytes,Ua: NEGATIVE
Nitrite: NEGATIVE
Protein, ur: NEGATIVE mg/dL
Specific Gravity, Urine: 1.004 — ABNORMAL LOW (ref 1.005–1.030)
pH: 7 (ref 5.0–8.0)

## 2019-06-23 LAB — COMPREHENSIVE METABOLIC PANEL
ALT: 15 U/L (ref 0–44)
AST: 18 U/L (ref 15–41)
Albumin: 2.9 g/dL — ABNORMAL LOW (ref 3.5–5.0)
Alkaline Phosphatase: 64 U/L (ref 38–126)
Anion gap: 9 (ref 5–15)
BUN: <5 mg/dL — ABNORMAL LOW (ref 6–20)
CO2: 22 mmol/L (ref 22–32)
Calcium: 8.7 mg/dL — ABNORMAL LOW (ref 8.9–10.3)
Chloride: 106 mmol/L (ref 98–111)
Creatinine, Ser: 0.51 mg/dL (ref 0.44–1.00)
GFR calc Af Amer: >60 mL/min (ref >60)
GFR calc non Af Amer: >60 mL/min (ref >60)
Glucose, Bld: 86 mg/dL (ref 70–99)
Potassium: 3.6 mmol/L (ref 3.5–5.1)
Sodium: 137 mmol/L (ref 135–145)
Total Bilirubin: 0.2 mg/dL — ABNORMAL LOW (ref 0.3–1.2)
Total Protein: 6.2 g/dL — ABNORMAL LOW (ref 6.5–8.1)

## 2019-06-23 LAB — CBC
HCT: 35 % — ABNORMAL LOW (ref 36.0–46.0)
Hemoglobin: 11.2 g/dL — ABNORMAL LOW (ref 12.0–15.0)
MCH: 25.9 pg — ABNORMAL LOW (ref 26.0–34.0)
MCHC: 32 g/dL (ref 30.0–36.0)
MCV: 81 fL (ref 80.0–100.0)
Platelets: 248 10*3/uL (ref 150–400)
RBC: 4.32 MIL/uL (ref 3.87–5.11)
RDW: 14.4 % (ref 11.5–15.5)
WBC: 7.7 10*3/uL (ref 4.0–10.5)
nRBC: 0 % (ref 0.0–0.2)

## 2019-06-23 LAB — HEMOGLOBIN A1C
Hgb A1c MFr Bld: 5.2 % (ref 4.8–5.6)
Mean Plasma Glucose: 102.54 mg/dL

## 2019-06-23 LAB — HIV ANTIBODY (ROUTINE TESTING W REFLEX): HIV Screen 4th Generation wRfx: NONREACTIVE

## 2019-06-23 MED ORDER — ASPIRIN EC 81 MG PO TBEC: 81.0000 mg | DELAYED_RELEASE_TABLET | Freq: Every day | ORAL | 2 refills | Status: AC

## 2019-06-23 MED ORDER — PREPLUS 27-1 MG PO TABS: 1.0000 | ORAL_TABLET | Freq: Every day | ORAL | 13 refills | Status: AC

## 2019-06-23 NOTE — MAU Note (Addendum)
Pt is pregnant, this is first time to see a dr. [name] Holmes know how far along.  Been in pain since last night until this morning. No pain currently. No bleeding or d/c.

## 2019-06-23 NOTE — MAU Provider Note (Signed)
History     CSN: [id number]  Arrival date and time: 06/23/19 1453   First Provider Initiated Contact with Patient 06/23/19 1601      Chief Complaint  Patient presents with  . prenatal care   HPI Stephanie Holmes is a 28 y.o. [phone] at [redacted]w[redacted]d by uncertain LMP who presents to MAU with chief complaint of lower abdominal pain. This is a new problem, onset last night. Patient's pain is located bilaterally near her groin and radiates up toward her hip bones. Aggravated by prolonged standing, walking and position changes. Denies pain upon arrival in MAU.  Also denies vaginal bleeding, leaking of fluid, decreased fetal movement, fever, falls, or recent illness.   Patient has not initiated prenatal care. She and her husband verbalize to CNM that they are not sure they will pursue prenatal care for this pregnancy.   OB History    Gravida  4   Para  3   Term  2   Preterm  1   AB      Living  3     SAB      TAB      Ectopic      Multiple  0   Live Births  2           Past Medical History:  Diagnosis Date  . Preterm labor     Past Surgical History:  Procedure Laterality Date  . NO PAST SURGERIES      Family History  Problem Relation Age of Onset  . Healthy Mother   . Diabetes Father   . Hypertension Father     Social History   Tobacco Use  . Smoking status: Never Smoker  . Smokeless tobacco: Never Used  Substance Use Topics  . Alcohol use: No  . Drug use: Never    Allergies: No Known Allergies  Medications Prior to Admission  Medication Sig Dispense Refill Last Dose  . ibuprofen (ADVIL,MOTRIN) 600 MG tablet Take 1 tablet (600 mg total) by mouth every 6 (six) hours as needed for moderate pain or cramping. 30 tablet 2 Past Month at Unknown time    Review of Systems  Constitutional: Negative for fever.  Eyes: Negative for visual disturbance.  Respiratory: Negative for shortness of breath.   Gastrointestinal: Positive for abdominal pain.   Genitourinary: Negative for difficulty urinating, dysuria, vaginal bleeding, vaginal discharge and vaginal pain.  Musculoskeletal: Negative for back pain.  Neurological: Negative for headaches.  All other systems reviewed and are negative.  Physical Exam   Blood pressure (!) 104/50, pulse 81, temperature 98.1 F (36.7 C), temperature source Oral, resp. rate 18, weight 96.5 kg, last menstrual period 01/04/2019, SpO2 100 %, unknown if currently breastfeeding.  Physical Exam  Nursing note and vitals reviewed. Constitutional: She is oriented to person, place, and time. She appears well-developed and well-nourished.  Cardiovascular: Normal rate.  Respiratory: Effort normal and breath sounds normal.  GI: Soft. She exhibits no distension. There is no abdominal tenderness. There is no rebound and no guarding.  Genitourinary:    Genitourinary Comments: Declined by patient   Musculoskeletal: Normal range of motion.  Neurological: She is alert and oriented to person, place, and time.  Skin: Skin is warm and dry.  Psychiatric: She has a normal mood and affect. Her behavior is normal. Judgment and thought content normal.    MAU Course/MDM  Procedures  --Fundal height 23cm, consistent with LMP and ultrasound --pelvic exam and vaginal swabs declined by patient --Cervix closed  and long on ultrasound --Late to care at 28 weeks with previous pregnancy --OB history includes:  Patient Active Problem List   Diagnosis Date Noted  . History of pre-eclampsia in prior pregnancy, currently pregnant 12/12/2017  . History of preterm labor 12/12/2017  . History of postpartum hemorrhage 12/12/2017  . 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 Language barrier 12/12/2017  . Obesity in pregnancy 12/12/2017   --Advised daily prenatal, ASA 81mg   Patient Vitals for the past 24 hrs:  BP Temp Temp src Pulse Resp SpO2 Weight  06/23/19 1707 110/74 - - 79 - - -  06/23/19 1552 (!) 104/50 - - 81 - - -  06/23/19 1529  122/76 98.1 F (36.7 C) Oral 82 18 100 % 96.5 kg   Results for orders placed or performed during the hospital encounter of 06/23/19 (from the past 24 hour(s))  Urinalysis, Routine w reflex microscopic     Status: Abnormal   Collection Time: 06/23/19  4:02 PM  Result Value Ref Range   Color, Urine STRAW (A) YELLOW   APPearance CLEAR CLEAR   Specific Gravity, Urine 1.004 (L) 1.005 - 1.030   pH 7.0 5.0 - 8.0   Glucose, UA NEGATIVE NEGATIVE mg/dL   Hgb urine dipstick SMALL (A) NEGATIVE   Bilirubin Urine NEGATIVE NEGATIVE   Ketones, ur NEGATIVE NEGATIVE mg/dL   Protein, ur NEGATIVE NEGATIVE mg/dL   Nitrite NEGATIVE NEGATIVE   Leukocytes,Ua NEGATIVE NEGATIVE   RBC / HPF 0-5 0 - 5 RBC/hpf   WBC, UA 0-5 0 - 5 WBC/hpf   Bacteria, UA NONE SEEN NONE SEEN   Squamous Epithelial / LPF 0-5 0 - 5   Sperm, UA PRESENT   CBC     Status: Abnormal   Collection Time: 06/23/19  4:16 PM  Result Value Ref Range   WBC 7.7 4.0 - 10.5 K/uL   RBC 4.32 3.87 - 5.11 MIL/uL   Hemoglobin 11.2 (L) 12.0 - 15.0 g/dL   HCT 35.0 (L) 36.0 - 46.0 %   MCV 81.0 80.0 - 100.0 fL   MCH 25.9 (L) 26.0 - 34.0 pg   MCHC 32.0 30.0 - 36.0 g/dL   RDW 14.4 11.5 - 15.5 %   Platelets 248 150 - 400 K/uL   nRBC 0.0 0.0 - 0.2 %  Comprehensive metabolic panel     Status: Abnormal   Collection Time: 06/23/19  4:16 PM  Result Value Ref Range   Sodium 137 135 - 145 mmol/L   Potassium 3.6 3.5 - 5.1 mmol/L   Chloride 106 98 - 111 mmol/L   CO2 22 22 - 32 mmol/L   Glucose, Bld 86 70 - 99 mg/dL   BUN <5 (L) 6 - 20 mg/dL   Creatinine, Ser 0.51 0.44 - 1.00 mg/dL   Calcium 8.7 (L) 8.9 - 10.3 mg/dL   Total Protein 6.2 (L) 6.5 - 8.1 g/dL   Albumin 2.9 (L) 3.5 - 5.0 g/dL   AST 18 15 - 41 U/L   ALT 15 0 - 44 U/L   Alkaline Phosphatase 64 38 - 126 U/L   Total Bilirubin 0.2 (L) 0.3 - 1.2 mg/dL   GFR calc non Af Amer >60 >60 mL/min   GFR calc Af Amer >60 >60 mL/min   Anion gap 9 5 - 15  Hemoglobin A1c     Status: None   Collection Time:  06/23/19  4:16 PM  Result Value Ref Range   Hgb A1c MFr Bld 5.2 4.8 - 5.6 %  Mean Plasma Glucose 102.54 mg/dL    Assessment and Plan  --28 y.o. [id number] at [redacted]w[redacted]d  --Likely round ligament pain --Prenatal labs collected due to possibility of no prenatal care --Reassuring fetal surveillance today --Discharge home in stable condition  F/U: --Patient given contact information for [facility] --Declined my offer to message clinic to request appointment  Darlina Rumpf, CNM 06/23/2019, 6:30 PM

## 2019-06-24 LAB — RPR: RPR Ser Ql: NONREACTIVE

## 2019-07-05 ENCOUNTER — Ambulatory Visit (INDEPENDENT_AMBULATORY_CARE_PROVIDER_SITE_OTHER): Payer: Managed Care, Other (non HMO) | Admitting: Family Medicine

## 2019-07-05 ENCOUNTER — Encounter: Payer: Self-pay | Admitting: Family Medicine

## 2019-07-05 ENCOUNTER — Other Ambulatory Visit: Payer: Self-pay

## 2019-07-05 VITALS — BP 104/67 | HR 93 | Wt 213.2 lb

## 2019-07-05 DIAGNOSIS — O0992 Supervision of high risk pregnancy, unspecified, second trimester: Secondary | ICD-10-CM

## 2019-07-05 DIAGNOSIS — Z789 Other specified health status: Secondary | ICD-10-CM

## 2019-07-05 DIAGNOSIS — Z113 Encounter for screening for infections with a predominantly sexual mode of transmission: Secondary | ICD-10-CM | POA: Diagnosis not present

## 2019-07-05 DIAGNOSIS — O0990 Supervision of high risk pregnancy, unspecified, unspecified trimester: Secondary | ICD-10-CM

## 2019-07-05 DIAGNOSIS — Z3A36 36 weeks gestation of pregnancy: Secondary | ICD-10-CM

## 2019-07-05 LAB — POCT URINALYSIS DIP (DEVICE)
Bilirubin Urine: NEGATIVE
Glucose, UA: NEGATIVE mg/dL
Ketones, ur: NEGATIVE mg/dL
Nitrite: NEGATIVE
Protein, ur: NEGATIVE mg/dL
Specific Gravity, Urine: 1.025 (ref 1.005–1.030)
Urobilinogen, UA: 0.2 mg/dL (ref 0.0–1.0)
pH: 7 (ref 5.0–8.0)

## 2019-07-05 NOTE — Progress Notes (Signed)
History:   Stephanie Holmes is a 28 y.o. [phone] at [redacted]w[redacted]d by LMP (around 01/04/2019) and 24wk [place] being seen today for her first obstetrical visit.  Her obstetrical history is significant for hx of preterm delivery at 34w with second pregnancy. Patient does not intend to breast feed. Pregnancy history fully reviewed.  Patient reports no complaints. Was having some abdominal pain and went to MAU on 12/2 but reports pain is better.       HISTORY: OB History  Gravida Para Term Preterm AB Living  4 3 2 1  0 3  SAB TAB Ectopic Multiple Live Births  0 0 0 0 2    # Outcome Date GA Lbr Len/2nd Weight Sex Delivery Anes PTL Lv  4 Current           3 Term 02/16/18 [redacted]w[redacted]d 02:20 / 00:21 6 lb 10 oz (3.005 kg) M Vag-Spont None  LIV     Name: Holmes,BOY Stephanie     Apgar1: 8  Apgar5: 9  2 Preterm 06/21/13 [redacted]w[redacted]d 12:53 / 00:10 4 lb 12.5 oz (2.169 kg) F Vag-Spont None  LIV     Name: Holmes, Stephanie     Apgar1: 8  Apgar5: 9  1 Term 2009    M Vag-Spont       Last pap smear was done June 2019 and was normal per patient report; denies any abnormal Paps. Will request records.   Past Medical History:  Diagnosis Date  . Group B Streptococcus carrier, antepartum 02/16/2018  . Late prenatal care 12/12/2017  . NSVD (normal spontaneous vaginal delivery) 02/16/2018  . Preterm labor   . Proteinuria affecting pregnancy 12/19/2017   Unsure if baseline. PCR ratio 370. No elevated BPs. H/o PPH.   . Supervision of high risk pregnancy in second trimester 12/12/2017    Nursing Staff Provider Office Location  [facility] Dating   LMP c/w late 2nd trimester [place] Language  [place] Anatomy US   Normal Flu Vaccine   Genetic Screen  NIPS low risk  TDaP vaccine  12/26/17  Hgb A1C or  GTT Early too late Third trimester: normal Rhogam   N/A   LAB RESULTS  Feeding Plan Breast/Bottle Blood Type A/Positive/-- (05/10 1008)  Contraception unsure Antibody Negative (05/10 1008) Circumc   Past Surgical History:  Procedure Laterality Date  . NO PAST  SURGERIES     Family History  Problem Relation Age of Onset  . Healthy Mother   . Diabetes Father   . Hypertension Father    Social History   Tobacco Use  . Smoking status: Never Smoker  . Smokeless tobacco: Never Used  Substance Use Topics  . Alcohol use: No  . Drug use: Never   No Known Allergies Current Outpatient Medications on File Prior to Visit  Medication Sig Dispense Refill  . aspirin EC 81 MG tablet Take 1 tablet (81 mg total) by mouth daily. Take after 12 weeks for prevention of preeclampsia later in pregnancy 300 tablet 2  . Prenatal Vit-Fe Fumarate-FA (PREPLUS) 27-1 MG TABS Take 1 tablet by mouth daily. 30 tablet 13   No current facility-administered medications on file prior to visit.    Review of Systems Pertinent items noted in HPI and remainder of comprehensive ROS otherwise negative. Physical Exam:   Vitals:   07/05/19 1532  BP: 104/67  Pulse: 93  Weight: 213 lb 3.2 oz (96.7 kg)   Fetal Heart Rate (bpm): 159 Uterus:  Fundal Height: 25 cm  Pelvic Exam: Perineum:  Deferred    Vulva:    Vagina:     Cervix:    Adnexa:    Bony Pelvis: average  System: General: well-developed, well-nourished female in no acute distress   Breasts:  normal appearance, no masses or tenderness bilaterally   Skin: normal coloration and turgor, no rashes   Neurologic: oriented, normal, negative, normal mood   Extremities: normal strength, tone, and muscle mass, ROM of all joints is normal   HEENT extraocular movement intact and sclera clear, anicteric   Mouth/Teeth mucous membranes moist, pharynx normal without lesions and dental hygiene good   Neck supple and no masses   Cardiovascular: regular rate    Respiratory:  no respiratory distress   Abdomen: soft, non-tender; bowel sounds normal; no masses,  no organomegaly      Assessment:    Pregnancy: [id number] Patient Active Problem List   Diagnosis Date Noted  . Supervision of high risk pregnancy, antepartum 07/05/2019    . History of pre-eclampsia in prior pregnancy, currently pregnant 12/12/2017  . History of preterm labor 12/12/2017  . History of postpartum hemorrhage 12/12/2017  . 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 Language barrier 12/12/2017  . Obesity in pregnancy 12/12/2017     Plan:    Enza was seen today for initial prenatal visit.  Diagnoses and all orders for this visit:  Supervision of high risk pregnancy, antepartum -     Culture, OB Urine -     Obstetric Panel, Including HIV -     Tdap vaccine greater than or equal to 7yo IM -     Cervicovaginal ancillary only( ) - A1c 5.2 on 12/2 - Has not been taking ASA; denies any notable PMH - Reviewed [place]; WNL; f/u as clinically indicated  - Return in 2 weeks for GTT - Tdap today  - BTL consent at next visit  Language barrier       - Phone interpreter used  Initial labs drawn. Continue prenatal vitamins. Genetic Screening discussed, First trimester screen, Quad screen and NIPS: declined. Ultrasound discussed; fetal anatomic survey: previously completed 12/2 and WNL; f/u as clinically indicated. Problem list reviewed and updated. The nature of [facility] with multiple MDs and other Advanced Practice Providers was explained to patient; also emphasized that residents, students are part of our team. Routine obstetric precautions reviewed. Return in about 2 weeks (around 07/19/2019) for LOB; in-person, GTT at that time.    Barrington Ellison, MD [facility] Fellow, [facility] for [employer], [place]

## 2019-07-06 LAB — CERVICOVAGINAL ANCILLARY ONLY
Chlamydia: NEGATIVE
Comment: NEGATIVE
Comment: NORMAL
Neisseria Gonorrhea: NEGATIVE

## 2019-07-06 LAB — OBSTETRIC PANEL, INCLUDING HIV
Antibody Screen: NEGATIVE
Basophils Absolute: 0.1 10*3/uL (ref 0.0–0.2)
Basos: 1 %
EOS (ABSOLUTE): 0.4 10*3/uL (ref 0.0–0.4)
Eos: 4 %
HIV Screen 4th Generation wRfx: NONREACTIVE
Hematocrit: 36.5 % (ref 34.0–46.6)
Hemoglobin: 11.5 g/dL (ref 11.1–15.9)
Hepatitis B Surface Ag: NEGATIVE
Immature Grans (Abs): 0.1 10*3/uL (ref 0.0–0.1)
Immature Granulocytes: 1 %
Lymphocytes Absolute: 1.9 10*3/uL (ref 0.7–3.1)
Lymphs: 21 %
MCH: 25.5 pg — ABNORMAL LOW (ref 26.6–33.0)
MCHC: 31.5 g/dL (ref 31.5–35.7)
MCV: 81 fL (ref 79–97)
Monocytes Absolute: 0.8 10*3/uL (ref 0.1–0.9)
Monocytes: 9 %
Neutrophils Absolute: 5.8 10*3/uL (ref 1.4–7.0)
Neutrophils: 64 %
Platelets: 263 10*3/uL (ref 150–450)
RBC: 4.51 x10E6/uL (ref 3.77–5.28)
RDW: 13.1 % (ref 11.7–15.4)
RPR Ser Ql: NONREACTIVE
Rh Factor: POSITIVE
Rubella Antibodies, IGG: 21.3 index (ref >0.99)
WBC: 9 10*3/uL (ref 3.4–10.8)

## 2019-07-07 LAB — CULTURE, OB URINE

## 2019-07-07 LAB — URINE CULTURE, OB REFLEX

## 2019-07-13 ENCOUNTER — Other Ambulatory Visit: Payer: Self-pay | Admitting: *Deleted

## 2019-07-13 DIAGNOSIS — O0990 Supervision of high risk pregnancy, unspecified, unspecified trimester: Secondary | ICD-10-CM

## 2019-07-19 ENCOUNTER — Other Ambulatory Visit: Payer: Self-pay

## 2019-07-19 ENCOUNTER — Other Ambulatory Visit: Payer: Managed Care, Other (non HMO)

## 2019-07-19 ENCOUNTER — Ambulatory Visit (INDEPENDENT_AMBULATORY_CARE_PROVIDER_SITE_OTHER): Payer: Managed Care, Other (non HMO) | Admitting: Nurse Practitioner

## 2019-07-19 VITALS — BP 108/72 | HR 94 | Wt 212.0 lb

## 2019-07-19 DIAGNOSIS — O0990 Supervision of high risk pregnancy, unspecified, unspecified trimester: Secondary | ICD-10-CM

## 2019-07-19 DIAGNOSIS — O0993 Supervision of high risk pregnancy, unspecified, third trimester: Secondary | ICD-10-CM

## 2019-07-19 DIAGNOSIS — Z23 Encounter for immunization: Secondary | ICD-10-CM | POA: Diagnosis not present

## 2019-07-19 DIAGNOSIS — Q513 Bicornate uterus: Secondary | ICD-10-CM

## 2019-07-19 DIAGNOSIS — Z3A28 28 weeks gestation of pregnancy: Secondary | ICD-10-CM

## 2019-07-19 DIAGNOSIS — O09293 Supervision of pregnancy with other poor reproductive or obstetric history, third trimester: Secondary | ICD-10-CM

## 2019-07-19 DIAGNOSIS — O09299 Supervision of pregnancy with other poor reproductive or obstetric history, unspecified trimester: Secondary | ICD-10-CM

## 2019-07-19 DIAGNOSIS — Z789 Other specified health status: Secondary | ICD-10-CM

## 2019-07-19 NOTE — Patient Instructions (Signed)
For assistance with applying for medicaid please call Suzanna Obey [id number] or Quentin Ore [phone].

## 2019-07-19 NOTE — Progress Notes (Signed)
    Subjective:  Stephanie Holmes is a 28 y.o. [phone] at [redacted]w[redacted]d being seen today for ongoing prenatal care.  She is currently monitored for the following issues for this high-risk pregnancy and has History of pre-eclampsia in prior pregnancy, currently pregnant; History of preterm labor; History of postpartum hemorrhage; Bicornuate uterus; Language barrier; Obesity in pregnancy; and Supervision of high risk pregnancy, antepartum on their problem list.  Patient reports no complaints.  Contractions: Not present. Vag. Bleeding: None.  Movement: Present. Denies leaking of fluid.   The following portions of the patient's history were reviewed and updated as appropriate: allergies, current medications, past family history, past medical history, past social history, past surgical history and problem list. Problem list updated.  Objective:   Vitals:   07/19/19 0931  BP: 108/72  Pulse: 94  Weight: 212 lb (96.2 kg)    Fetal Status: Fetal Heart Rate (bpm): 146 Fundal Height: 30 cm Movement: Present     General:  Alert, oriented and cooperative. Patient is in no acute distress.  Skin: Skin is warm and dry. No rash noted.   Cardiovascular: Normal heart rate noted  Respiratory: Normal respiratory effort, no problems with respiration noted  Abdomen: Soft, gravid, appropriate for gestational age. Pain/Pressure: Absent     Pelvic:  Cervical exam deferred        Extremities: Normal range of motion.  Edema: None  Mental Status: Normal mood and affect. Normal behavior. Normal judgment and thought content.   Urinalysis:      Assessment and Plan:  Pregnancy: [id number] at [redacted]w[redacted]d  1. Supervision of high risk pregnancy, antepartum Client definitely wants to have BTL.  States her current insurance does not cover the pregnancy and previous pregnancies, she has had Medicaid as well.  Given phone number in AVS to call for assistance in applying for Medicaid.  2. Language barrier Client from [place] and speaks  [place].  Attempted to get virtual interpreter and was on hold for the entire visit.  No interpreter ever connected.  Client does speak English and with several explanations was able to understand her and proceed through the visit.  3. History of pre-eclampsia in prior pregnancy, currently pregnant Reviewed taking low dose aspirin - had stopped taking but will start taking again.  4. Bicornuate uterus Had preterm birth in first pregnancy but then had a full term birth. No contractions, no pain  Preterm labor symptoms and general obstetric precautions including but not limited to vaginal bleeding, contractions, leaking of fluid and fetal movement were reviewed in detail with the patient. Please refer to After Visit Summary for other counseling recommendations.  Return in about 2 weeks (around 08/02/2019) for in person visit. as client does not have MyChart on her phone.  Earlie Server, RN, MSN, NP-BC Nurse Practitioner, [facility] for [employer], [facility] 07/19/2019 10:02 AM

## 2019-07-20 LAB — GLUCOSE TOLERANCE, 2 HOURS W/ 1HR
Glucose, 1 hour: 160 mg/dL (ref 65–179)
Glucose, 2 hour: 86 mg/dL (ref 65–152)
Glucose, Fasting: 78 mg/dL (ref 65–91)

## 2019-07-23 NOTE — L&D Delivery Note (Signed)
Delivery Note Pt became complete at 1300 and had SROM of clear fluid with pushing at  1305. She continued to push well and at 1:19 PM a viable female was delivered via Vaginal, Spontaneous (Presentation: Right Occiput Anterior).  APGAR: 9, 9; weight 3510gm (7lb 11.8oz).  Infant dried and placed on pt's abd; cord clamped and cut by CNM after 82min delay; hospital cord blood sample collected. Placenta status: Spontaneous, Intact.  Cord: 3 vessels   Anesthesia: None Episiotomy: None Lacerations: None Est. Blood Loss (mL): 300  After delivery, the pt's IV became unusable; due to the valves in her veins, while waiting on IV access to be reobtained, cytotec 833mcg was given PR for atony prevention.  Mom to postpartum.  Baby to Couplet care / Skin to Skin.  Myrtis Ser CNM 09/27/2019, 2:11 PM  Addendum: Called at 1415 due to persistent uterine atony; orders given for TXA and Methergine to be given; exam performed with approx 2x10cm membrane/placental fragment retrieved with resultant fundal firmness and decreased bldg. Total birth EBL: 410cc  KS  Please schedule this patient for Postpartum visit in: 4 weeks with the following provider: Any provider In-Person For C/S patients schedule nurse incision check in weeks 2 weeks: no Low risk pregnancy complicated by: history PPH and pre-e (none this preg) Delivery mode:  SVD Anticipated Birth Control:  BTL done PP (planning on this 3/9) PP Procedures needed: none  Schedule Integrated BH visit: no

## 2019-08-03 ENCOUNTER — Other Ambulatory Visit: Payer: Self-pay

## 2019-08-03 ENCOUNTER — Ambulatory Visit (INDEPENDENT_AMBULATORY_CARE_PROVIDER_SITE_OTHER): Payer: Managed Care, Other (non HMO) | Admitting: Student

## 2019-08-03 DIAGNOSIS — Z3A36 36 weeks gestation of pregnancy: Secondary | ICD-10-CM

## 2019-08-03 DIAGNOSIS — O0990 Supervision of high risk pregnancy, unspecified, unspecified trimester: Secondary | ICD-10-CM

## 2019-08-03 DIAGNOSIS — O0993 Supervision of high risk pregnancy, unspecified, third trimester: Secondary | ICD-10-CM

## 2019-08-03 NOTE — Progress Notes (Addendum)
Patient ID: Stephanie Holmes, female   DOB: 15-Mar-1991, 29 y.o.   MRN: [id number]   PRENATAL VISIT NOTE  Subjective:  Stephanie Holmes is a 29 y.o. [phone] at [redacted]w[redacted]d being seen today for ongoing prenatal care.  She is currently monitored for the following issues for this low-risk pregnancy and has History of pre-eclampsia in prior pregnancy, currently pregnant; History of preterm labor; History of postpartum hemorrhage; Bicornuate uterus; Language barrier; Obesity in pregnancy; and Supervision of high risk pregnancy, antepartum on their problem list.  Patient reports no complaints.  Contractions: Not present. Vag. Bleeding: None.  Movement: Present. Denies leaking of fluid.   The following portions of the patient's history were reviewed and updated as appropriate: allergies, current medications, past family history, past medical history, past social history, past surgical history and problem list.   Objective:   Vitals:   08/03/19 0948  BP: 107/75  Pulse: 75  Weight: 212 lb 1.6 oz (96.2 kg)    Fetal Status: Fetal Heart Rate (bpm): 163 Fundal Height: 31 cm Movement: Present     General:  Alert, oriented and cooperative. Patient is in no acute distress.  Skin: Skin is warm and dry. No rash noted.   Cardiovascular: Normal heart rate noted  Respiratory: Normal respiratory effort, no problems with respiration noted  Abdomen: Soft, gravid, appropriate for gestational age.  Pain/Pressure: Present     Pelvic: Cervical exam deferred        Extremities: Normal range of motion.  Edema: None  Mental Status: Normal mood and affect. Normal behavior. Normal judgment and thought content.   Assessment and Plan:  Pregnancy: [id number] at [redacted]w[redacted]d 1. Supervision of high risk pregnancy, antepartum -patient will fill out medicaid application today; she does not have citizenship or green card so unsure if she will be approved. I explained that we have to sign BTL papers 30 days before surgery so she needs to make sure that  she gets Medicaid as soon as possible.   -She also has [place], which she does not know if she will pay for BTL. She will talk to her husband to find out.  -Will order detailed [place] because she has only had limited.  -Reviewed and wrote down the BP numbers that she needs to watch out for (140/90 or greater); patient has been checking BP and writing it down and will bring at next visit.   Preterm labor symptoms and general obstetric precautions including but not limited to vaginal bleeding, contractions, leaking of fluid and fetal movement were reviewed in detail with the patient. Please refer to After Visit Summary for other counseling recommendations.   Return in about 3 weeks (around 08/24/2019), or in person LROB to sign BTL and please schedule anatomy scan ASAP.  Future Appointments  Date Time Provider [place]  08/10/2019  2:45 PM [place] NURSE [place] [facility]-US  08/10/2019  2:45 PM [place] 2 WH-MFCUS M[facility]-US  08/24/2019 10:55 AM [place], Mervyn Skeeters, CNM [facility]OCA WOC    [place], [place]

## 2019-08-03 NOTE — Progress Notes (Addendum)
[employer] interpreters; no interpreter available at this time. Visit completed without interpreter; pt speaks minimal English.   Appt made with [place] interpreters for [place] interpreter during next visit 08/24/19 at 1055. Reference [id number].  Apolonio Schneiders RN 08/03/2019

## 2019-08-10 ENCOUNTER — Ambulatory Visit (HOSPITAL_COMMUNITY): Payer: Managed Care, Other (non HMO) | Admitting: *Deleted

## 2019-08-10 ENCOUNTER — Ambulatory Visit (HOSPITAL_COMMUNITY)
Admission: RE | Admit: 2019-08-10 | Discharge: 2019-08-10 | Disposition: A | Discharge status: Home / Self Care | Payer: Managed Care, Other (non HMO) | Source: Ambulatory Visit | Attending: Nurse Practitioner | Admitting: Nurse Practitioner

## 2019-08-10 ENCOUNTER — Encounter (HOSPITAL_COMMUNITY): Payer: Self-pay | Admitting: *Deleted

## 2019-08-10 ENCOUNTER — Other Ambulatory Visit: Payer: Self-pay

## 2019-08-10 ENCOUNTER — Other Ambulatory Visit (HOSPITAL_COMMUNITY): Payer: Self-pay | Admitting: *Deleted

## 2019-08-10 DIAGNOSIS — O99213 Obesity complicating pregnancy, third trimester: Secondary | ICD-10-CM | POA: Diagnosis not present

## 2019-08-10 DIAGNOSIS — E669 Obesity, unspecified: Secondary | ICD-10-CM

## 2019-08-10 DIAGNOSIS — O3403 Maternal care for unspecified congenital malformation of uterus, third trimester: Secondary | ICD-10-CM

## 2019-08-10 DIAGNOSIS — O09893 Supervision of other high risk pregnancies, third trimester: Secondary | ICD-10-CM

## 2019-08-10 DIAGNOSIS — O0990 Supervision of high risk pregnancy, unspecified, unspecified trimester: Secondary | ICD-10-CM | POA: Insufficient documentation

## 2019-08-10 DIAGNOSIS — O09213 Supervision of pregnancy with history of pre-term labor, third trimester: Secondary | ICD-10-CM

## 2019-08-10 DIAGNOSIS — Q513 Bicornate uterus: Secondary | ICD-10-CM

## 2019-08-10 DIAGNOSIS — O0933 Supervision of pregnancy with insufficient antenatal care, third trimester: Secondary | ICD-10-CM

## 2019-08-10 DIAGNOSIS — O09293 Supervision of pregnancy with other poor reproductive or obstetric history, third trimester: Secondary | ICD-10-CM

## 2019-08-10 DIAGNOSIS — Z3A31 31 weeks gestation of pregnancy: Secondary | ICD-10-CM

## 2019-08-24 ENCOUNTER — Ambulatory Visit (INDEPENDENT_AMBULATORY_CARE_PROVIDER_SITE_OTHER): Payer: Managed Care, Other (non HMO) | Admitting: Student

## 2019-08-24 ENCOUNTER — Other Ambulatory Visit: Payer: Self-pay

## 2019-08-24 VITALS — BP 105/63 | HR 89 | Temp 97.7°F | Wt 217.3 lb

## 2019-08-24 DIAGNOSIS — Z3A33 33 weeks gestation of pregnancy: Secondary | ICD-10-CM

## 2019-08-24 DIAGNOSIS — O09293 Supervision of pregnancy with other poor reproductive or obstetric history, third trimester: Secondary | ICD-10-CM

## 2019-08-24 DIAGNOSIS — O0990 Supervision of high risk pregnancy, unspecified, unspecified trimester: Secondary | ICD-10-CM

## 2019-08-24 NOTE — Progress Notes (Signed)
Patient ID: Stephanie Holmes, female   DOB: 1991-02-10, 29 y.o.   MRN: [id number]   PRENATAL VISIT NOTE  Subjective:  Stephanie Holmes is a 29 y.o. [phone] at [redacted]w[redacted]d being seen today for ongoing prenatal care.  She is currently monitored for the following issues for this low-risk pregnancy and has History of pre-eclampsia in prior pregnancy, currently pregnant; History of preterm labor; History of postpartum hemorrhage; Bicornuate uterus; Language barrier; Obesity in pregnancy; and Supervision of high risk pregnancy, antepartum on their problem list.  Patient reports no complaints. She states that she has BP cuff at home and that she has been checking it at home.   Contractions: Not present. Vag. Bleeding: None.  Movement: Present. Denies leaking of fluid.   The following portions of the patient's history were reviewed and updated as appropriate: allergies, current medications, past family history, past medical history, past social history, past surgical history and problem list.   Objective:   Vitals:   08/24/19 1129  BP: 105/63  Pulse: 89  Temp: 97.7 F (36.5 C)  Weight: 217 lb 4.8 oz (98.6 kg)    Fetal Status: Fetal Heart Rate (bpm): 152   Movement: Present     General:  Alert, oriented and cooperative. Patient is in no acute distress.  Skin: Skin is warm and dry. No rash noted.   Cardiovascular: Normal heart rate noted  Respiratory: Normal respiratory effort, no problems with respiration noted  Abdomen: Soft, gravid, appropriate for gestational age.  Pain/Pressure: Present     Pelvic: Cervical exam deferred        Extremities: Normal range of motion.  Edema: None  Mental Status: Normal mood and affect. Normal behavior. Normal judgment and thought content.   Assessment and Plan:  Pregnancy: [id number] at [redacted]w[redacted]d 1. Supervision of high risk pregnancy, antepartum Reviewed warning signs of pre-eclampsia; Keep checking BP at home. Reviewed warning numbers and when to come to hospital  (140/90). Signed BTL today  Confirmed pediatrician today  Preterm labor symptoms and general obstetric precautions including but not limited to vaginal bleeding, contractions, leaking of fluid and fetal movement were reviewed in detail with the patient. Please refer to After Visit Summary for other counseling recommendations.   No follow-ups on file.  Future Appointments  Date Time Provider [place]  09/08/2019  3:00 PM [place] NURSE [place] [facility]  09/08/2019  3:00 PM [place] Stephanie Holmes, CNM

## 2019-08-24 NOTE — Progress Notes (Signed)
[place] interpreter ID# [id number] used for encounter. Pt has [employer] and has also applied for Medicaid. She does not yet have card however states she was told she is "in the system".  BTL consent signed.

## 2019-08-26 ENCOUNTER — Encounter: Payer: Self-pay | Admitting: *Deleted

## 2019-09-07 ENCOUNTER — Encounter: Payer: Managed Care, Other (non HMO) | Admitting: Certified Nurse Midwife

## 2019-09-07 NOTE — Progress Notes (Deleted)
Careers information officer for [employer] interpreter, no one available at this time, an appointment will need to be scheduled with an Interpreter for this pt. She will need to be re-scheduled.

## 2019-09-08 ENCOUNTER — Encounter (HOSPITAL_COMMUNITY): Payer: Self-pay

## 2019-09-08 ENCOUNTER — Ambulatory Visit (HOSPITAL_COMMUNITY)
Admission: RE | Admit: 2019-09-08 | Discharge: 2019-09-08 | Disposition: A | Discharge status: Home / Self Care | Payer: Managed Care, Other (non HMO) | Source: Ambulatory Visit | Attending: Obstetrics and Gynecology | Admitting: Obstetrics and Gynecology

## 2019-09-08 ENCOUNTER — Ambulatory Visit (HOSPITAL_COMMUNITY): Payer: Managed Care, Other (non HMO) | Admitting: *Deleted

## 2019-09-08 ENCOUNTER — Other Ambulatory Visit: Payer: Self-pay

## 2019-09-08 DIAGNOSIS — Q513 Bicornate uterus: Secondary | ICD-10-CM | POA: Insufficient documentation

## 2019-09-08 DIAGNOSIS — Z3A35 35 weeks gestation of pregnancy: Secondary | ICD-10-CM

## 2019-09-08 DIAGNOSIS — O0990 Supervision of high risk pregnancy, unspecified, unspecified trimester: Secondary | ICD-10-CM

## 2019-09-08 DIAGNOSIS — O3403 Maternal care for unspecified congenital malformation of uterus, third trimester: Secondary | ICD-10-CM | POA: Diagnosis not present

## 2019-09-14 ENCOUNTER — Encounter: Payer: Managed Care, Other (non HMO) | Admitting: Student

## 2019-09-19 ENCOUNTER — Inpatient Hospital Stay (HOSPITAL_COMMUNITY)
Admission: AD | Admit: 2019-09-19 | Discharge: 2019-09-19 | Disposition: A | Discharge status: Home / Self Care | Payer: Managed Care, Other (non HMO) | Attending: Obstetrics & Gynecology | Admitting: Obstetrics & Gynecology

## 2019-09-19 ENCOUNTER — Other Ambulatory Visit: Payer: Self-pay

## 2019-09-19 ENCOUNTER — Encounter (HOSPITAL_COMMUNITY): Payer: Self-pay | Admitting: Obstetrics & Gynecology

## 2019-09-19 DIAGNOSIS — O4703 False labor before 37 completed weeks of gestation, third trimester: Secondary | ICD-10-CM | POA: Diagnosis not present

## 2019-09-19 DIAGNOSIS — Z3A36 36 weeks gestation of pregnancy: Secondary | ICD-10-CM | POA: Insufficient documentation

## 2019-09-19 DIAGNOSIS — O0990 Supervision of high risk pregnancy, unspecified, unspecified trimester: Secondary | ICD-10-CM

## 2019-09-19 DIAGNOSIS — O479 False labor, unspecified: Secondary | ICD-10-CM

## 2019-09-19 HISTORY — DX: Gestational (pregnancy-induced) hypertension without significant proteinuria, unspecified trimester: O13.9

## 2019-09-19 NOTE — MAU Provider Note (Signed)
S: Ms. Elexis Siedlecki is a 29 y.o. [phone] at [redacted]w[redacted]d  who presents to MAU today complaining contractions q 10 minutes since 1100. She denies vaginal bleeding. She denies LOF. She reports normal fetal movement.    O: BP 117/79 (BP Location: Right Arm)   Pulse 93   Temp (!) 97.5 F (36.4 C) (Oral)   Resp 16   LMP 01/04/2019   SpO2 100% Comment: room air GENERAL: Well-developed, well-nourished female in no acute distress.  HEAD: Normocephalic, atraumatic.  CHEST: Normal effort of breathing, regular heart rate ABDOMEN: Soft, nontender, gravid  Cervical exam:  Dilation: 4.5 Effacement (%): 60 Cervical Position: Middle Station: -3 Presentation: Vertex Exam by:: Erasmo Score RNC   Fetal Monitoring: Baseline: 135 Variability: moderate Accelerations: 15x15 Decelerations: none Contractions: occasional uc's   A: SIUP at [redacted]w[redacted]d  False labor  P: -Discharge home in stable condition -Labor precautions discussed -Patient advised to follow-up with [facility] as scheduled for prenatal care -Patient may return to MAU as needed or if her condition were to change or worsen   Wende Mott, [place] 09/19/2019 2:47 PM

## 2019-09-19 NOTE — Discharge Instructions (Signed)
Braxton Hicks Contractions °Contractions of the uterus can occur throughout pregnancy, but they are not always a sign that you are in labor. You may have practice contractions called Braxton Hicks contractions. These false labor contractions are sometimes confused with true labor. °What are Braxton Hicks contractions? °Braxton Hicks contractions are tightening movements that occur in the muscles of the uterus before labor. Unlike true labor contractions, these contractions do not result in opening (dilation) and thinning of the cervix. Toward the end of pregnancy (32-34 weeks), Braxton Hicks contractions can happen more often and may become stronger. These contractions are sometimes difficult to tell apart from true labor because they can be very uncomfortable. You should not feel embarrassed if you go to the hospital with false labor. °Sometimes, the only way to tell if you are in true labor is for your health care provider to look for changes in the cervix. The health care provider will do a physical exam and may monitor your contractions. If you are not in true labor, the exam should show that your cervix is not dilating and your water has not broken. °If there are no other health problems associated with your pregnancy, it is completely safe for you to be sent home with false labor. You may continue to have Braxton Hicks contractions until you go into true labor. °How to tell the difference between true labor and false labor °True labor °· Contractions last 30-70 seconds. °· Contractions become very regular. °· Discomfort is usually felt in the top of the uterus, and it spreads to the lower abdomen and low back. °· Contractions do not go away with walking. °· Contractions usually become more intense and increase in frequency. °· The cervix dilates and gets thinner. °False labor °· Contractions are usually shorter and not as strong as true labor contractions. °· Contractions are usually irregular. °· Contractions  are often felt in the front of the lower abdomen and in the groin. °· Contractions may go away when you walk around or change positions while lying down. °· Contractions get weaker and are shorter-lasting as time goes on. °· The cervix usually does not dilate or become thin. °Follow these instructions at home: ° °· Take over-the-counter and prescription medicines only as told by your health care provider. °· Keep up with your usual exercises and follow other instructions from your health care provider. °· Eat and drink lightly if you think you are going into labor. °· If Braxton Hicks contractions are making you uncomfortable: °? Change your position from lying down or resting to walking, or change from walking to resting. °? Sit and rest in a tub of warm water. °? Drink enough fluid to keep your urine pale yellow. Dehydration may cause these contractions. °? Do slow and deep breathing several times an hour. °· Keep all follow-up prenatal visits as told by your health care provider. This is important. °Contact a health care provider if: °· You have a fever. °· You have continuous pain in your abdomen. °Get help right away if: °· Your contractions become stronger, more regular, and closer together. °· You have fluid leaking or gushing from your vagina. °· You pass blood-tinged mucus (bloody show). °· You have bleeding from your vagina. °· You have low back pain that you never had before. °· You feel your baby’s head pushing down and causing pelvic pressure. °· Your baby is not moving inside you as much as it used to. °Summary °· Contractions that occur before labor are   called Braxton Hicks contractions, false labor, or practice contractions. °· Braxton Hicks contractions are usually shorter, weaker, farther apart, and less regular than true labor contractions. True labor contractions usually become progressively stronger and regular, and they become more frequent. °· Manage discomfort from Braxton Hicks contractions  by changing position, resting in a warm bath, drinking plenty of water, or practicing deep breathing. °This information is not intended to replace advice given to you by your health care provider. Make sure you discuss any questions you have with your health care provider. °Document Revised: 06/20/2017 Document Reviewed: 11/21/2016 °Elsevier Patient Education © 2020 [employer] Inc. ° °

## 2019-09-19 NOTE — MAU Note (Signed)
I have communicated with Len Blalock CNM and reviewed vital signs:  Vitals:   09/19/19 1314 09/19/19 1450  BP: 117/79 122/70  Pulse: 93 74  Resp: 16 16  Temp: (!) 97.5 F (36.4 C)   SpO2: 100% 100%    Vaginal exam:  Dilation: 4.5 Effacement (%): 60 Cervical Position: Middle Station: -3 Presentation: Vertex Exam by:: Erasmo Score RNC,   Also reviewed contraction pattern and that non-stress test is reactive.  It has been documented that patient is occasionally contracting with no cervical change over 1 hours not indicating active labor.  Patient denies any other complaints.  Based on this report provider has given order for discharge.  A discharge order and diagnosis entered by a provider.   Labor discharge instructions reviewed with patient.

## 2019-09-19 NOTE — MAU Note (Signed)
Stephanie Holmes is a 30 y.o. at [redacted]w[redacted]d here in MAU reporting: contractions since last night, they are coming frequently. No bleeding, no LOF. +FM  Onset of complaint: last night  Pain score: 5/10  Vitals:   09/19/19 1314  BP: 117/79  Pulse: 93  Resp: 16  Temp: (!) 97.5 F (36.4 C)  SpO2: 100%     FHT: +FM  Lab orders placed from triage: none

## 2019-09-27 ENCOUNTER — Encounter (HOSPITAL_COMMUNITY): Payer: Self-pay | Admitting: Family Medicine

## 2019-09-27 ENCOUNTER — Inpatient Hospital Stay (HOSPITAL_COMMUNITY)
Admission: AD | Admit: 2019-09-27 | Discharge: 2019-09-29 | DRG: 807 | Disposition: A | Discharge status: Home / Self Care | Payer: Managed Care, Other (non HMO) | Attending: Family Medicine | Admitting: Family Medicine

## 2019-09-27 DIAGNOSIS — O0990 Supervision of high risk pregnancy, unspecified, unspecified trimester: Secondary | ICD-10-CM

## 2019-09-27 DIAGNOSIS — Z3A38 38 weeks gestation of pregnancy: Secondary | ICD-10-CM

## 2019-09-27 DIAGNOSIS — Z8751 Personal history of pre-term labor: Secondary | ICD-10-CM

## 2019-09-27 DIAGNOSIS — Q513 Bicornate uterus: Secondary | ICD-10-CM

## 2019-09-27 DIAGNOSIS — Z30017 Encounter for initial prescription of implantable subdermal contraceptive: Secondary | ICD-10-CM | POA: Diagnosis not present

## 2019-09-27 DIAGNOSIS — O3403 Maternal care for unspecified congenital malformation of uterus, third trimester: Secondary | ICD-10-CM | POA: Diagnosis present

## 2019-09-27 DIAGNOSIS — Z8759 Personal history of other complications of pregnancy, childbirth and the puerperium: Secondary | ICD-10-CM

## 2019-09-27 DIAGNOSIS — Z789 Other specified health status: Secondary | ICD-10-CM | POA: Diagnosis present

## 2019-09-27 DIAGNOSIS — Z20822 Contact with and (suspected) exposure to covid-19: Secondary | ICD-10-CM | POA: Diagnosis present

## 2019-09-27 DIAGNOSIS — O09299 Supervision of pregnancy with other poor reproductive or obstetric history, unspecified trimester: Secondary | ICD-10-CM

## 2019-09-27 DIAGNOSIS — O09293 Supervision of pregnancy with other poor reproductive or obstetric history, third trimester: Secondary | ICD-10-CM

## 2019-09-27 DIAGNOSIS — O26893 Other specified pregnancy related conditions, third trimester: Secondary | ICD-10-CM | POA: Diagnosis present

## 2019-09-27 LAB — RESPIRATORY PANEL BY RT PCR (FLU A&B, COVID)
Influenza A by PCR: NEGATIVE
Influenza B by PCR: NEGATIVE
SARS Coronavirus 2 by RT PCR: NEGATIVE

## 2019-09-27 LAB — TYPE AND SCREEN
ABO/RH(D): A POS
Antibody Screen: NEGATIVE

## 2019-09-27 LAB — CBC
HCT: 39.3 % (ref 36.0–46.0)
Hemoglobin: 12.1 g/dL (ref 12.0–15.0)
MCH: 24.4 pg — ABNORMAL LOW (ref 26.0–34.0)
MCHC: 30.8 g/dL (ref 30.0–36.0)
MCV: 79.2 fL — ABNORMAL LOW (ref 80.0–100.0)
Platelets: 237 10*3/uL (ref 150–400)
RBC: 4.96 MIL/uL (ref 3.87–5.11)
RDW: 15.4 % (ref 11.5–15.5)
WBC: 8.4 10*3/uL (ref 4.0–10.5)
nRBC: 0 % (ref 0.0–0.2)

## 2019-09-27 LAB — ABO/RH: ABO/RH(D): A POS

## 2019-09-27 LAB — GROUP B STREP BY PCR: Group B strep by PCR: NEGATIVE

## 2019-09-27 MED ORDER — METHYLERGONOVINE MALEATE 0.2 MG/ML IJ SOLN
INTRAMUSCULAR | Status: AC
  Filled 2019-09-27: qty 1

## 2019-09-27 MED ORDER — OXYCODONE-ACETAMINOPHEN 5-325 MG PO TABS: 1.0000 | ORAL_TABLET | ORAL | Status: DC | PRN

## 2019-09-27 MED ORDER — SENNOSIDES-DOCUSATE SODIUM 8.6-50 MG PO TABS
2.0000 | ORAL_TABLET | ORAL | Status: DC
  Administered 2019-09-28 (×2): 2 via ORAL
  Filled 2019-09-27 (×2): qty 2

## 2019-09-27 MED ORDER — ONDANSETRON HCL 4 MG/2ML IJ SOLN: 4.0000 mg | INTRAMUSCULAR | Status: DC | PRN

## 2019-09-27 MED ORDER — ACETAMINOPHEN 325 MG PO TABS: 650.0000 mg | ORAL_TABLET | ORAL | Status: DC | PRN

## 2019-09-27 MED ORDER — SIMETHICONE 80 MG PO CHEW: 80.0000 mg | CHEWABLE_TABLET | ORAL | Status: DC | PRN

## 2019-09-27 MED ORDER — TETANUS-DIPHTH-ACELL PERTUSSIS 5-2.5-18.5 LF-MCG/0.5 IM SUSP: 0.5000 mL | Freq: Once | INTRAMUSCULAR | Status: DC

## 2019-09-27 MED ORDER — OXYTOCIN BOLUS FROM INFUSION
500.0000 mL | Freq: Once | INTRAVENOUS | Status: AC
  Administered 2019-09-27: 14:00:00 500 mL via INTRAVENOUS

## 2019-09-27 MED ORDER — ONDANSETRON HCL 4 MG PO TABS: 4.0000 mg | ORAL_TABLET | ORAL | Status: DC | PRN

## 2019-09-27 MED ORDER — ONDANSETRON HCL 4 MG/2ML IJ SOLN: 4.0000 mg | Freq: Four times a day (QID) | INTRAMUSCULAR | Status: DC | PRN

## 2019-09-27 MED ORDER — ACETAMINOPHEN 325 MG PO TABS
650.0000 mg | ORAL_TABLET | ORAL | Status: DC | PRN
  Administered 2019-09-27 – 2019-09-28 (×3): 650 mg via ORAL
  Filled 2019-09-27 (×3): qty 2

## 2019-09-27 MED ORDER — SOD CITRATE-CITRIC ACID 500-334 MG/5ML PO SOLN: 30.0000 mL | ORAL | Status: DC | PRN

## 2019-09-27 MED ORDER — FAMOTIDINE 20 MG PO TABS
40.0000 mg | ORAL_TABLET | Freq: Once | ORAL | Status: AC
  Administered 2019-09-28: 40 mg via ORAL
  Filled 2019-09-27: qty 2

## 2019-09-27 MED ORDER — IBUPROFEN 600 MG PO TABS
600.0000 mg | ORAL_TABLET | Freq: Four times a day (QID) | ORAL | Status: DC
  Administered 2019-09-27 – 2019-09-29 (×7): 600 mg via ORAL
  Filled 2019-09-27 (×7): qty 1

## 2019-09-27 MED ORDER — BENZOCAINE-MENTHOL 20-0.5 % EX AERO: 1.0000 "application " | INHALATION_SPRAY | CUTANEOUS | Status: DC | PRN

## 2019-09-27 MED ORDER — WITCH HAZEL-GLYCERIN EX PADS: 1.0000 "application " | MEDICATED_PAD | CUTANEOUS | Status: DC | PRN

## 2019-09-27 MED ORDER — FENTANYL CITRATE (PF) 100 MCG/2ML IJ SOLN: 100.0000 ug | INTRAMUSCULAR | Status: DC | PRN

## 2019-09-27 MED ORDER — LIDOCAINE HCL (PF) 1 % IJ SOLN: 30.0000 mL | INTRAMUSCULAR | Status: DC | PRN

## 2019-09-27 MED ORDER — PRENATAL MULTIVITAMIN CH
1.0000 | ORAL_TABLET | Freq: Every day | ORAL | Status: DC
  Administered 2019-09-29: 14:00:00 1 via ORAL
  Filled 2019-09-27: qty 1

## 2019-09-27 MED ORDER — DIBUCAINE (PERIANAL) 1 % EX OINT: 1.0000 "application " | TOPICAL_OINTMENT | CUTANEOUS | Status: DC | PRN

## 2019-09-27 MED ORDER — LACTATED RINGERS IV SOLN: 500.0000 mL | INTRAVENOUS | Status: DC | PRN

## 2019-09-27 MED ORDER — METOCLOPRAMIDE HCL 10 MG PO TABS
10.0000 mg | ORAL_TABLET | Freq: Once | ORAL | Status: AC
  Administered 2019-09-28: 10 mg via ORAL
  Filled 2019-09-27: qty 1

## 2019-09-27 MED ORDER — TRANEXAMIC ACID-NACL 1000-0.7 MG/100ML-% IV SOLN
INTRAVENOUS | Status: AC
  Filled 2019-09-27: qty 100

## 2019-09-27 MED ORDER — LACTATED RINGERS IV SOLN: INTRAVENOUS | Status: DC

## 2019-09-27 MED ORDER — MEASLES, MUMPS & RUBELLA VAC IJ SOLR: 0.5000 mL | Freq: Once | INTRAMUSCULAR | Status: DC

## 2019-09-27 MED ORDER — ZOLPIDEM TARTRATE 5 MG PO TABS: 5.0000 mg | ORAL_TABLET | Freq: Every evening | ORAL | Status: DC | PRN

## 2019-09-27 MED ORDER — MISOPROSTOL 200 MCG PO TABS
ORAL_TABLET | ORAL | Status: AC
  Filled 2019-09-27: qty 4

## 2019-09-27 MED ORDER — OXYCODONE HCL 5 MG PO TABS: 5.0000 mg | ORAL_TABLET | ORAL | Status: DC | PRN

## 2019-09-27 MED ORDER — METHYLERGONOVINE MALEATE 0.2 MG/ML IJ SOLN
0.2000 mg | Freq: Once | INTRAMUSCULAR | Status: AC
  Administered 2019-09-27: 14:00:00 0.2 mg via INTRAMUSCULAR

## 2019-09-27 MED ORDER — OXYTOCIN 40 UNITS IN NORMAL SALINE INFUSION - SIMPLE MED
2.5000 [IU]/h | INTRAVENOUS | Status: DC
  Filled 2019-09-27: qty 1000

## 2019-09-27 MED ORDER — MISOPROSTOL 200 MCG PO TABS: 800.0000 ug | ORAL_TABLET | Freq: Once | ORAL | Status: DC

## 2019-09-27 MED ORDER — OXYCODONE-ACETAMINOPHEN 5-325 MG PO TABS: 2.0000 | ORAL_TABLET | ORAL | Status: DC | PRN

## 2019-09-27 MED ORDER — TRANEXAMIC ACID-NACL 1000-0.7 MG/100ML-% IV SOLN
1000.0000 mg | INTRAVENOUS | Status: AC
  Administered 2019-09-27: 1000 mg via INTRAVENOUS

## 2019-09-27 MED ORDER — COCONUT OIL OIL: 1.0000 "application " | TOPICAL_OIL | Status: DC | PRN

## 2019-09-27 MED ORDER — DIPHENHYDRAMINE HCL 25 MG PO CAPS: 25.0000 mg | ORAL_CAPSULE | Freq: Four times a day (QID) | ORAL | Status: DC | PRN

## 2019-09-27 NOTE — Discharge Summary (Signed)
Postpartum Discharge Summary     Patient Name: Stephanie Holmes DOB: 04-04-1991 MRN: [id number]  Date of admission: 09/27/2019 Delivering Provider: Serita Grammes D   Date of discharge: 09/29/2019  Admitting diagnosis: Labor and delivery, indication for care [O75.9] Intrauterine pregnancy: [redacted]w[redacted]d    Secondary diagnosis:  Active Problems:   History of pre-eclampsia in prior pregnancy, currently pregnant   History of preterm labor   History of postpartum hemorrhage   Bicornuate uterus   Language barrier   Labor and delivery, indication for care  Additional problems: none     Discharge diagnosis: Term Pregnancy Delivered                                                                                                Post partum procedures: Nexplanon  Augmentation: none  Complications: None  Hospital course:  Onset of Labor With Vaginal Delivery     29y.o. yo [id number] admitted in Active Labor on 09/27/2019. Patient had an uncomplicated, precipitous labor course as follows:  Membrane Rupture Time/Date: 1:05 PM ,09/27/2019   Intrapartum Procedures: Episiotomy: None [1]                                         Lacerations:  None [1]  Patient had a delivery of a Viable infant. 09/27/2019  Information for the patient's newborn:  ElOnesty, Clair0[[id number]]Delivery Method: Vag-Spont     Pateint had an uncomplicated postpartum course. Patient is discharged home in stable condition on 09/29/19.  Delivery time: 1:19 PM    Magnesium Sulfate received: No BMZ received: No Rhophylac:N/A MMR:N/A Transfusion:No  Physical exam  Vitals:   09/28/19 0900 09/28/19 1420 09/28/19 2200 09/29/19 0530  BP: 112/79 112/70 110/65 (!) 97/59  Pulse: (!) 56 (!) 57 (!) 54 (!) 57  Resp: 18 20  18   Temp: 97.6 F (36.4 C) (!) 97.5 F (36.4 C)  98.2 F (36.8 C)  TempSrc: Oral Oral  Oral  SpO2: 100% 98%  100%   General: alert, cooperative and no distress Lochia: appropriate Uterine Fundus:  firm Incision: N/A DVT Evaluation: No evidence of DVT seen on physical exam. No cords or calf tenderness. No significant calf/ankle edema. Labs: Lab Results  Component Value Date   WBC 8.4 09/27/2019   HGB 12.1 09/27/2019   HCT 39.3 09/27/2019   MCV 79.2 (L) 09/27/2019   PLT 237 09/27/2019   CMP Latest Ref Rng & Units 06/23/2019  Glucose 70 - 99 mg/dL 86  BUN 6 - 20 mg/dL <5(L)  Creatinine 0.44 - 1.00 mg/dL 0.51  Sodium 135 - 145 mmol/L 137  Potassium 3.5 - 5.1 mmol/L 3.6  Chloride 98 - 111 mmol/L 106  CO2 22 - 32 mmol/L 22  Calcium 8.9 - 10.3 mg/dL 8.7(L)  Total Protein 6.5 - 8.1 g/dL 6.2(L)  Total Bilirubin 0.3 - 1.2 mg/dL 0.2(L)  Alkaline Phos 38 - 126 U/L 64  AST 15 - 41 U/L 18  ALT 0 -  44 U/L 15   Edinburgh Score: Edinburgh Postnatal Depression Scale Screening Tool 02/17/2018  I have been able to laugh and see the funny side of things. 1  I have looked forward with enjoyment to things. 2  I have blamed myself unnecessarily when things went wrong. 1  I have been anxious or worried for no good reason. 2  I have felt scared or panicky for no good reason. 3  Things have been getting on top of me. 2  I have been so unhappy that I have had difficulty sleeping. 3  I have felt sad or miserable. 0  I have been so unhappy that I have been crying. 2  The thought of harming myself has occurred to me. 0  Edinburgh Postnatal Depression Scale Total 16    Discharge instruction: per After Visit Summary and "Baby and Me Booklet".  After visit meds:  Allergies as of 09/29/2019   No Known Allergies     Medication List    TAKE these medications   aspirin EC 81 MG tablet Take 1 tablet (81 mg total) by mouth daily. Take after 12 weeks for prevention of preeclampsia later in pregnancy   ibuprofen 600 MG tablet Commonly known as: ADVIL Take 1 tablet (600 mg total) by mouth every 6 (six) hours.   PrePLUS 27-1 MG Tabs Take 1 tablet by mouth daily.       Diet: routine  diet  Activity: Advance as tolerated. Pelvic rest for 6 weeks.   Outpatient follow up:4 weeks Follow up Appt: Future Appointments  Date Time Provider Craig  11/01/2019  3:55 PM Jorje Guild, NP [facility]   Follow up Visit:  Please schedule this patient for Postpartum visit in: 4 weeks with the following provider: Any provider In-Person For C/S patients schedule nurse incision check in weeks 2 weeks: no Low risk pregnancy complicated by: history PPH and pre-e (none this preg) Delivery mode:  SVD Anticipated Birth Control:  Nexplanon (changed mind from having BTL) PP Procedures needed: none  Schedule Integrated BH visit: no  Newborn Data: Live born female  Birth Weight: 3510gm (7lb 11.8oz)  APGAR: 69, 9  Newborn Delivery   Birth date/time: 09/27/2019 13:19:00 Delivery type: Vaginal, Spontaneous      Baby Feeding: Bottle Disposition:home with mother   09/29/2019 Laury Deep, CNM

## 2019-09-27 NOTE — Progress Notes (Signed)
FHR baseline indeterminate secondary not enough tracing to establish baseline

## 2019-09-27 NOTE — H&P (Signed)
Stephanie Holmes is a 29 y.o. female [phone] @ 38.0wks by LMP c/w scan @ 24wks presenting for reg ctx. Denies leaking or bldg. Her preg has been followed by the [facility] service and has been remarkable for:  # language barrier (speaks [place]ian) # onset of care at 24wks (MAU)/ 26wks (office) # bicornuate uterus # hx 34wk delivery # hx pre-e # hx [place] # desires sterilization (papers signed 08/24/19)  OB History    Gravida  4   Para  3   Term  2   Preterm  1   AB      Living  3     SAB      TAB      Ectopic      Multiple  0   Live Births  2          Past Medical History:  Diagnosis Date  . Group B Streptococcus carrier, antepartum 02/16/2018  . Late prenatal care 12/12/2017  . NSVD (normal spontaneous vaginal delivery) 02/16/2018  . Pregnancy induced hypertension   . Preterm labor   . Proteinuria affecting pregnancy 12/19/2017   Unsure if baseline. PCR ratio 370. No elevated BPs. H/o PPH.   . Supervision of high risk pregnancy in second trimester 12/12/2017    Nursing Staff Provider Office Location  [facility] Dating   LMP c/w late 2nd trimester [place] Language  [place]ian Anatomy US   Normal Flu Vaccine   Genetic Screen  NIPS low risk  TDaP vaccine  12/26/17  Hgb A1C or  GTT Early too late Third trimester: normal Rhogam   N/A   LAB RESULTS  Feeding Plan Breast/Bottle Blood Type A/Positive/-- (05/10 1008)  Contraception unsure Antibody Negative (05/10 1008) Circumc   Past Surgical History:  Procedure Laterality Date  . NO PAST SURGERIES     Family History: family history includes Diabetes in her father; Healthy in her mother; Hypertension in her father. Social History:  reports that she has never smoked. She has never used smokeless tobacco. She reports that she does not drink alcohol or use drugs.     Maternal Diabetes: No Genetic Screening: Declined Maternal Ultrasounds/Referrals: Normal Fetal Ultrasounds or other Referrals:  None Maternal Substance Abuse:  No Significant  Maternal Medications:  None Significant Maternal Lab Results:  Other: GBS PCR pending Other Comments:  onset of care at 24wks; 4 visits total  Review of Systems History Dilation: 10 Effacement (%): 100 Station: 0 Exam by:: [place], rn Blood pressure 129/70, pulse 80, temperature (!) 97.5 F (36.4 C), temperature source Oral, resp. rate 18, last menstrual period 01/04/2019, SpO2 100 %, unknown if currently breastfeeding. Exam Physical Exam  Constitutional: She is oriented to person, place, and time. She appears well-developed.  HENT:  Head: Normocephalic.  Cardiovascular: Normal rate.  Respiratory: Effort normal.  GI:  EFM 130-140, +accels, no decels, Cat 1 Ctx q 2-3 mins  Genitourinary:    Vagina normal.   Musculoskeletal:        General: Normal range of motion.     Cervical back: Normal range of motion.  Neurological: She is alert and oriented to person, place, and time.  Skin: Skin is warm and dry.  Psychiatric: She has a normal mood and affect. Her behavior is normal. Thought content normal.    Prenatal labs: ABO, Rh: --/--/PENDING (03/08 1242) Antibody: PENDING (03/08 1242) Rubella: 21.30 (12/14 1654) RPR: Non Reactive (12/14 1654)  HBsAg: Negative (12/14 1654)  HIV: Non Reactive (12/14 1654)  GBS:  PCR pending  Assessment/Plan: IUP@38 .0wks Active labor/transition  Admit to Labor and Delivery Expectant management GBS PCR collected Anticipate SVD Plan for ppBTL later today/tomorrow   Myrtis Ser CNM 09/27/2019, 1:59 PM  ** unable to conduct H&P with an interpreter due to [place] interpreter not available after multiple attempts with [place] **

## 2019-09-27 NOTE — Lactation Note (Signed)
This note was copied from a baby's chart. Lactation Consultation Note  Patient Name: Boy Quan Sierra [id number] Date: 09/27/2019 Reason for consult: Follow-up assessment;Initial assessment;Early term 5-38.6wks  P4 mother whose infant is now 45 hours old.  This is an ETI at 38+0 weeks.  Mother breast fed her other three children for approximately one year each.    Mother's language is [place]ian.  This language is not available on the Ipad.  Per RN, mother speaks English also.  Baby was awake when I entered the room.  Offered to assist with latching and mother agreeable.  Mother's breasts are soft and non tender and nipples are everted and intact.  Attempted to latch in the cross cradle hold, however, baby would not open his mouth.  He was awake and looking toward mother.  Attempted a couple more times before placing him STS on mother's chest where he immediately fell asleep.  Reassured mother that he is tired and this is typical behavior for a baby at this age.  Reviewed feeding cues and mother will call as needed for latch assistance with the next feeding.  Mother is familiar with hand expression and did not wish to review.  Colostrum container provided and milk storage times discussed.  Finger feeding demonstrated.  Mom made aware of O/P services, breastfeeding support groups, community resources, and our phone # for post-discharge questions.  Mother does not desire a DEBP for home use.  No support person currently present.  RN updated.   Maternal Data Formula Feeding for Exclusion: Yes Reason for exclusion: Mother's choice to formula and breast feed on admission Has patient been taught Hand Expression?: Yes Does the patient have breastfeeding experience prior to this delivery?: Yes  Feeding Feeding Type: Breast Fed  LATCH Score Latch: Too sleepy or reluctant, no latch achieved, no sucking elicited.  Audible Swallowing: None  Type of Nipple: Everted at rest and after stimulation  Comfort  (Breast/Nipple): Soft / non-tender  Hold (Positioning): Assistance needed to correctly position infant at breast and maintain latch.  LATCH Score: 5  Interventions Interventions: Breast feeding basics reviewed;Assisted with latch;Skin to skin;Adjust position;Position options;Support pillows  Lactation Tools Discussed/Used     Consult Status Consult Status: Follow-up Date: 09/28/19 Follow-up type: In-patient    Adrijana Haros R Ryosuke Ericksen 09/27/2019, 5:09 PM

## 2019-09-28 ENCOUNTER — Encounter (HOSPITAL_COMMUNITY): Payer: Self-pay

## 2019-09-28 ENCOUNTER — Encounter (HOSPITAL_COMMUNITY)
Admission: AD | Disposition: A | Discharge status: Home / Self Care | Payer: Self-pay | Source: Home / Self Care | Attending: Family Medicine

## 2019-09-28 LAB — RPR: RPR Ser Ql: NONREACTIVE

## 2019-09-28 SURGERY — LIGATION, FALLOPIAN TUBE, POSTPARTUM
Anesthesia: Choice

## 2019-09-28 MED ORDER — LIDOCAINE HCL 1 % IJ SOLN
0.0000 mL | Freq: Once | INTRAMUSCULAR | Status: AC | PRN
  Administered 2019-09-29: 20 mL via INTRADERMAL
  Filled 2019-09-28: qty 20

## 2019-09-28 MED ORDER — ETONOGESTREL 68 MG ~~LOC~~ IMPL
68.0000 mg | DRUG_IMPLANT | Freq: Once | SUBCUTANEOUS | Status: AC
  Administered 2019-09-29: 68 mg via SUBCUTANEOUS
  Filled 2019-09-28: qty 1

## 2019-09-28 NOTE — Progress Notes (Signed)
CSW verbally consulted as CSW made aware that MOB and FOB are wanting to place baby up for adoption with MOB's brother. MOB and FOB reported that MOB carried infant for brother and now they are planning to give infant up for adoption to brother. CSW advised MOB and FOB that CSW's are unable to assist with adoption as, Adoption from CSW's knowledgeable requires Family Attorney to complete  paperwork. CSW encouraged MOB and FOB to reach out to local family attorney's to assist with this. MOB and FOB reported that they understood and asked no further questions. CSW aslo provided MOB with Adoption Agencies in [place] in the event that, potential adoptive family is unable to adopt infant.     Virgie Dad Pao Haffey, MSW, LCSW [facility] [place] [phone]

## 2019-09-28 NOTE — Anesthesia Preprocedure Evaluation (Deleted)
Anesthesia Evaluation    Reviewed: Allergy & Precautions, Patient's Chart, lab work & pertinent test results  Airway        Dental   Pulmonary neg pulmonary ROS,           Cardiovascular hypertension, negative cardio ROS       Neuro/Psych negative neurological ROS  negative psych ROS   GI/Hepatic negative GI ROS, Neg liver ROS,   Endo/Other  negative endocrine ROS  Renal/GU negative Renal ROS  negative genitourinary   Musculoskeletal negative musculoskeletal ROS (+)   Abdominal   Peds  Hematology negative hematology ROS (+)   Anesthesia Other Findings   Reproductive/Obstetrics                             Anesthesia Physical Anesthesia Plan Anesthesia Quick Evaluation

## 2019-09-28 NOTE — Progress Notes (Signed)
29 y.o. yo [phone]  with undesired fertility,status post vaginal delivery, desires permanent sterilization. Risks and benefits of procedure discussed with patient including permanence of method, bleeding, infection, injury to surrounding organs and need for additional procedures. Risk failure of 0.5-1% with increased risk of ectopic gestation if pregnancy occurs was also discussed with patient. Patient verbalized understanding and all questions were answered. Anesthesia in to speak with patient on anesthesia options.  After counseling patient opted for Nexplanon. Discussed with patient that this device is good for 3 years and may be associated with irregular vaginal bleeding which does not have any impact on it's effectiveness. Patient again verbalized understanding and all questions were answered  [place] interpreter [phone] used during this encounter

## 2019-09-28 NOTE — Progress Notes (Signed)
Went into room with dayshift RN to do bedside report. Went over NPO after midnight with pt. Informed her of why she should not eat or drink after and that she would be having her tubal at 10:30 tomorrow

## 2019-09-28 NOTE — Lactation Note (Signed)
This note was copied from a baby's chart. Lactation Consultation Note  Patient Name: Stephanie Holmes [id number] Date: 09/28/2019 Reason for consult: Follow-up assessment;Early term 20-38.6wks  P4 mother whose infant is now 87 hours old.  This is an ETI at 69+16 weeks old.  Mother breast fed her other three children for approximately one year each. Mother's feeding preference is breast/bottle.  Mother's language is Pohmpeian.  This language is not available on the IPad.  Father assists with translation and is currently here.  Mother speaks some [place].  Mother has been having difficulty with breast feeding.  Offered to assist with latching and mother accepted.  Asked mother to feed STS and to change baby's diaper prior to latching.  Mother's breasts are soft and non tender and nipples are everted and intact.  Positioned mother appropriately in the cross cradle hold and assisted baby to latch easily on the left breast.  Demonstrated proper hand/finger placement and breast compressions for mother to observe.  Baby had a wide gape and flanged lips and eagerly began sucking.  Showed mother how to gently stimulate him to keep him awake while feeding.  Mother smiled.  Encouraged to continue feeding on cue or at least every three hours.  Mother will call for latch assistance as needed.  She will continue to supplement with formula after breast feeding.    Mother does not desire a DEBP for home use.  RN updated.   Maternal Data Formula Feeding for Exclusion: Yes Reason for exclusion: Mother's choice to formula and breast feed on admission Has patient been taught Hand Expression?: Yes Does the patient have breastfeeding experience prior to this delivery?: Yes  Feeding Feeding Type: Breast Fed Nipple Type: Slow - flow  LATCH Score Latch: Grasps breast easily, tongue down, lips flanged, rhythmical sucking.  Audible Swallowing: A few with stimulation  Type of Nipple: Everted at rest and after  stimulation  Comfort (Breast/Nipple): Soft / non-tender  Hold (Positioning): Assistance needed to correctly position infant at breast and maintain latch.  LATCH Score: 8  Interventions Interventions: Breast feeding basics reviewed;Assisted with latch;Skin to skin;Hand express;Breast compression;Adjust position;Position options;Support pillows  Lactation Tools Discussed/Used     Consult Status Consult Status: Follow-up Date: 09/29/19 Follow-up type: In-patient    Letroy Vazguez R Deondrea Aguado 09/28/2019, 1:24 PM

## 2019-09-28 NOTE — Progress Notes (Signed)
Post Partum Day 1 Subjective: no complaints, up ad lib, voiding and tolerating PO, pain well controlled  Objective: Blood pressure 112/79, pulse (!) 56, temperature 97.6 F (36.4 C), temperature source Oral, resp. rate 18, last menstrual period 01/04/2019, SpO2 100 %, unknown if currently breastfeeding.  Physical Exam:  General: alert, cooperative and no distress Lochia: appropriate Uterine Fundus: firm Incision: n/a DVT Evaluation: No evidence of DVT seen on physical exam. Negative Homan's sign. No cords or calf tenderness. No significant calf/ankle edema.  Recent Labs    09/27/19 1242  HGB 12.1  HCT 39.3   Assessment/Plan: Plan for discharge tomorrow BTL scheduled for today, remain NPO  Spouse used for interpretation (consent under media)  LOS: 1 day   Julianne Handler, CNM 09/28/2019, 11:29 AM

## 2019-09-28 NOTE — Progress Notes (Signed)
Patient is interested in ppBTL but able to reach a [place] interpreter. Patient understands some English. Patient understanding of the situation. I will continue to try reaching interpreter this evening. Discussed other contraception options with patient and her husband. Patient desires Nexplanon

## 2019-09-29 MED ORDER — IBUPROFEN 600 MG PO TABS: 600.0000 mg | ORAL_TABLET | Freq: Four times a day (QID) | ORAL | 0 refills | Status: DC

## 2019-09-29 NOTE — Discharge Instructions (Signed)
Nexplanon Instructions After Insertion   Keep bandage clean and dry for 24 hours   Keep the steri-strips on your arm until they fall off. This usually takes 1-2 wks. If they fall off before 1 week, replace them with the ones we provided you.   May use ice/Tylenol/Ibuprofen for soreness or pain   If you develop fever, drainage or increased warmth from incision site-contact office immediately   Postpartum Care After Vaginal Delivery This sheet gives you information about how to care for yourself from the time you deliver your baby to up to 6-12 weeks after delivery (postpartum period). Your health care provider may also give you more specific instructions. If you have problems or questions, contact your health care provider. Follow these instructions at home: Vaginal bleeding  It is normal to have vaginal bleeding (lochia) after delivery. Wear a sanitary pad for vaginal bleeding and discharge. ? During the first week after delivery, the amount and appearance of lochia is often similar to a menstrual period. ? Over the next few weeks, it will gradually decrease to a dry, yellow-brown discharge. ? For most women, lochia stops completely by 4-6 weeks after delivery. Vaginal bleeding can vary from woman to woman.  Change your sanitary pads frequently. Watch for any changes in your flow, such as: ? A sudden increase in volume. ? A change in color. ? Large blood clots.  If you pass a blood clot from your vagina, save it and call your health care provider to discuss. Do not flush blood clots down the toilet before talking with your health care provider.  Do not use tampons or douches until your health care provider says this is safe.  If you are not breastfeeding, your period should return 6-8 weeks after delivery. If you are feeding your child breast milk only (exclusive breastfeeding), your period may not return until you stop breastfeeding. Perineal care  Keep the area between the  vagina and the anus (perineum) clean and dry as told by your health care provider. Use medicated pads and pain-relieving sprays and creams as directed.  If you had a cut in the perineum (episiotomy) or a tear in the vagina, check the area for signs of infection until you are healed. Check for: ? More redness, swelling, or pain. ? Fluid or blood coming from the cut or tear. ? Warmth. ? Pus or a bad smell.  You may be given a squirt bottle to use instead of wiping to clean the perineum area after you go to the bathroom. As you start healing, you may use the squirt bottle before wiping yourself. Make sure to wipe gently.  To relieve pain caused by an episiotomy, a tear in the vagina, or swollen veins in the anus (hemorrhoids), try taking a warm sitz bath 2-3 times a day. A sitz bath is a warm water bath that is taken while you are sitting down. The water should only come up to your hips and should cover your buttocks. Breast care  Within the first few days after delivery, your breasts may feel heavy, full, and uncomfortable (breast engorgement). Milk may also leak from your breasts. Your health care provider can suggest ways to help relieve the discomfort. Breast engorgement should go away within a few days.  If you are breastfeeding: ? Wear a bra that supports your breasts and fits you well. ? Keep your nipples clean and dry. Apply creams and ointments as told by your health care provider. ? You may need to  use breast pads to absorb milk that leaks from your breasts. ? You may have uterine contractions every time you breastfeed for up to several weeks after delivery. Uterine contractions help your uterus return to its normal size. ? If you have any problems with breastfeeding, work with your health care provider or Science writer.  If you are not breastfeeding: ? Avoid touching your breasts a lot. Doing this can make your breasts produce more milk. ? Wear a good-fitting bra and use cold  packs to help with swelling. ? Do not squeeze out (express) milk. This causes you to make more milk. Intimacy and sexuality  Ask your health care provider when you can engage in sexual activity. This may depend on: ? Your risk of infection. ? How fast you are healing. ? Your comfort and desire to engage in sexual activity.  You are able to get pregnant after delivery, even if you have not had your period. If desired, talk with your health care provider about methods of birth control (contraception). Medicines  Take over-the-counter and prescription medicines only as told by your health care provider.  If you were prescribed an antibiotic medicine, take it as told by your health care provider. Do not stop taking the antibiotic even if you start to feel better. Activity  Gradually return to your normal activities as told by your health care provider. Ask your health care provider what activities are safe for you.  Rest as much as possible. Try to rest or take a nap while your baby is sleeping. Eating and drinking   Drink enough fluid to keep your urine pale yellow.  Eat high-fiber foods every day. These may help prevent or relieve constipation. High-fiber foods include: ? Whole grain cereals and breads. ? Brown rice. ? Beans. ? Fresh fruits and vegetables.  Do not try to lose weight quickly by cutting back on calories.  Take your prenatal vitamins until your postpartum checkup or until your health care provider tells you it is okay to stop. Lifestyle  Do not use any products that contain nicotine or tobacco, such as cigarettes and e-cigarettes. If you need help quitting, ask your health care provider.  Do not drink alcohol, especially if you are breastfeeding. General instructions  Keep all follow-up visits for you and your baby as told by your health care provider. Most women visit their health care provider for a postpartum checkup within the first 3-6 weeks after  delivery. Contact a health care provider if:  You feel unable to cope with the changes that your child brings to your life, and these feelings do not go away.  You feel unusually sad or worried.  Your breasts become red, painful, or hard.  You have a fever.  You have trouble holding urine or keeping urine from leaking.  You have little or no interest in activities you used to enjoy.  You have not breastfed at all and you have not had a menstrual period for 12 weeks after delivery.  You have stopped breastfeeding and you have not had a menstrual period for 12 weeks after you stopped breastfeeding.  You have questions about caring for yourself or your baby.  You pass a blood clot from your vagina. Get help right away if:  You have chest pain.  You have difficulty breathing.  You have sudden, severe leg pain.  You have severe pain or cramping in your lower abdomen.  You bleed from your vagina so much that you fill more  than one sanitary pad in one hour. Bleeding should not be heavier than your heaviest period.  You develop a severe headache.  You faint.  You have blurred vision or spots in your vision.  You have bad-smelling vaginal discharge.  You have thoughts about hurting yourself or your baby. If you ever feel like you may hurt yourself or others, or have thoughts about taking your own life, get help right away. You can go to the nearest emergency department or call:  Your local emergency services (911 in the [place]  A suicide crisis helpline, such as the [facility] at (551)854-7320. This is open 24 hours a day. Summary  The period of time right after you deliver your newborn up to 6-12 weeks after delivery is called the postpartum period.  Gradually return to your normal activities as told by your health care provider.  Keep all follow-up visits for you and your baby as told by your health care provider. This information is not  intended to replace advice given to you by your health care provider. Make sure you discuss any questions you have with your health care provider. Document Revised: 07/11/2017 Document Reviewed: 04/21/2017 Elsevier Patient Education  2020 [employer].

## 2019-09-29 NOTE — Progress Notes (Addendum)
Patient ID: Stephanie Holmes, female   DOB: Jul 05, 1991, 29 y.o.   MRN: [id number] PROCEDURE NOTE   Ms. Stephanie Holmes is a 19 y.o. [phone] for Nexplanon insertion prior to discharge home today. [employer] Agricultural consultant # [phone] used for explanation of Nexplanon, procedure and to answer any questions. Husband was supposed to be here for insertion, but he called patient's phone on Facetime at the beginning of the procedure. He is aware that she is getting Nexplanon and we are using an interpreter for the procedure in his absence. No complaints. Patient had multiple questions about pill vs. Nexplanon, but finally decided on Nexplanon insertion.  BP (!) 97/59 (BP Location: Right Arm)   Pulse (!) 57 Comment: pt sleeping  Temp 98.2 F (36.8 C) (Oral)   Resp 18   LMP 01/04/2019   SpO2 100%   Breast and Formula feeding    Nexplanon Insertion Procedure Patient identified, informed consent performed, consent signed.   Patient does understand that irregular bleeding is a very common side effect of this medication. She was advised to have backup contraception for one week after placement. Pregnancy test in clinic today was negative.  Appropriate time out taken.  Patient's left arm was prepped and draped in the usual sterile fashion. The ruler used to measure and mark insertion area.  Patient was prepped with alcohol swab and then injected with 2.5 ml of 1% lidocaine.  She was prepped with betadine, Nexplanon removed from packaging,  Device confirmed in needle, then inserted full length of needle and withdrawn per handbook instructions. Nexplanon was able to palpated in the patient's arm; patient palpated the insert herself. There was minimal blood loss.  Patient insertion site covered with guaze and a pressure bandage to reduce any bruising.  The patient tolerated the procedure well and was given post procedure instructions. Follow-up via My Chart video visit , unless having problems and need to be seen in  the office.  Nexplanon Lot#: [id number] / Expiration Date: 01/19/2022  Laury Deep, CNM  09/29/2019 11:45 AM

## 2019-09-29 NOTE — Progress Notes (Signed)
POSTPARTUM PROGRESS NOTE  Post Partum Day 2  Subjective:  Stephanie Holmes is a 29 y.o. [id number] s/p SVD at 110w0d. She reports she is doing well. No acute events overnight. She denies any problems with ambulating, voiding or po intake. Denies nausea or vomiting.  Pain is well controlled.  Lochia is appropriate.  Objective: Blood pressure (!) 97/59, pulse (!) 57, temperature 98.2 F (36.8 C), temperature source Oral, resp. rate 18, last menstrual period 01/04/2019, SpO2 100 %, unknown if currently breastfeeding.  Physical Exam:  General: alert, cooperative and no distress Chest: no respiratory distress Heart:regular rate, distal pulses intact Abdomen: soft, nontender Uterine Fundus: firm, appropriately tender DVT Evaluation: No calf swelling or tenderness Extremities: no edema Skin: warm, dry  Recent Labs    09/27/19 1242  HGB 12.1  HCT 39.3    Assessment/Plan: Unkown Stephanie Holmes is a 67 y.o. [id number] s/p SVD at [redacted]w[redacted]d   PPD#2 - Doing well. Continue routine postpartum care. I was unable to get a [place] interpreter on the [facility] and her husband was not here. He has signed the papers to interpret for her. An appointment has been set up with the interpreter today at 1100 and her husband should be here by 0900. We will wait to get informed consent and confirm she still wants the nexplanon once one of these options has become available. Contraception: Nexplanon today Feeding: Bottle Dispo: Plan for discharge today.   LOS: 2 days    Jodene Nam MD, PGY-1 OBGYN Faculty Teaching Service  09/29/2019, 7:32 AM

## 2019-09-29 NOTE — Lactation Note (Signed)
This note was copied from a baby's chart. Lactation Consultation Note  Patient Name: Stephanie Holmes [id number] Date: 09/29/2019 Reason for consult: Follow-up assessment Mom has decided to put baby up for adoption so is now exclusively formula feeding.  Discussed breast care when milk comes to volume.  No questions or concerns.  Maternal Data    Feeding Feeding Type: Bottle Fed - Formula Nipple Type: Nfant Slow Flow (purple)  LATCH Score                   Interventions    Lactation Tools Discussed/Used     Consult Status Consult Status: Complete Follow-up type: Call as needed    Ave Filter 09/29/2019, 10:39 AM

## 2019-11-01 ENCOUNTER — Ambulatory Visit: Payer: Managed Care, Other (non HMO) | Admitting: Student

## 2019-11-01 ENCOUNTER — Telehealth: Payer: Self-pay | Admitting: Student

## 2019-11-01 NOTE — Telephone Encounter (Signed)
Attempted to contact patient with [place] interpreter ID# [id number] to give patient new postpartum visit. No answer, interpreter left voicemail with the appointment information. Patient was also given new address for her appointment. Instructed to give the office a call back if she needs to reschedule.

## 2019-11-30 ENCOUNTER — Ambulatory Visit: Payer: Managed Care, Other (non HMO) | Admitting: Student

## 2019-12-16 IMAGING — US US MFM OB LIMITED
2 series · 14 of 28 positions shown · non-contrast
Comparison: none

[Series 1: us mfm ob limited · 29 acquisitions, 11 frames shown (1 of 2)]
[im 2/29]
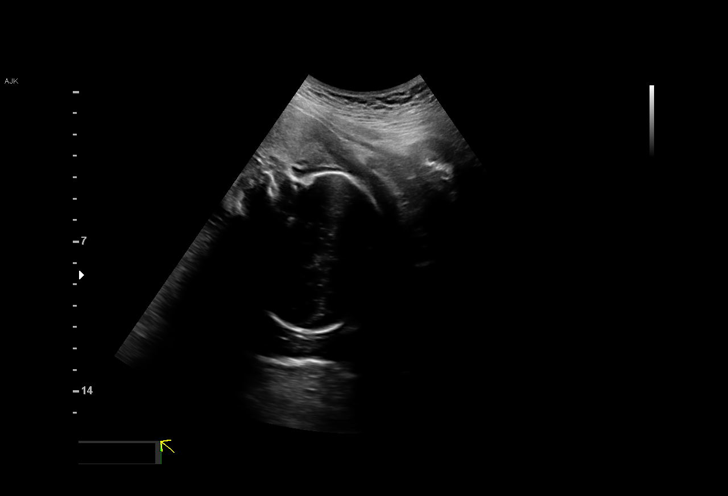
[im 4/29]
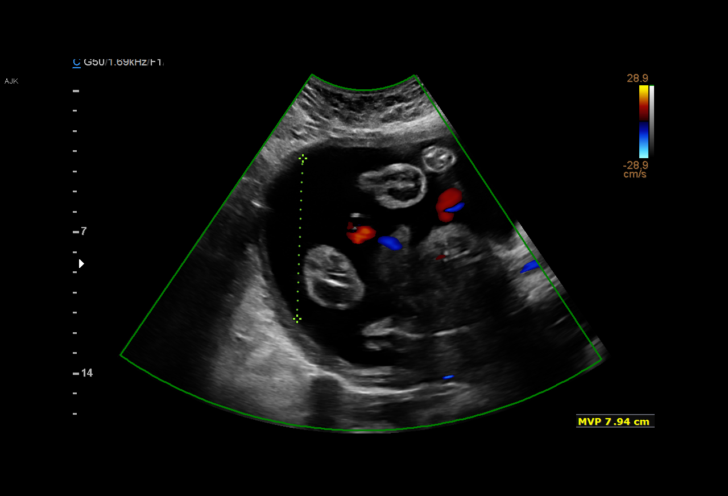
[im 7/29]
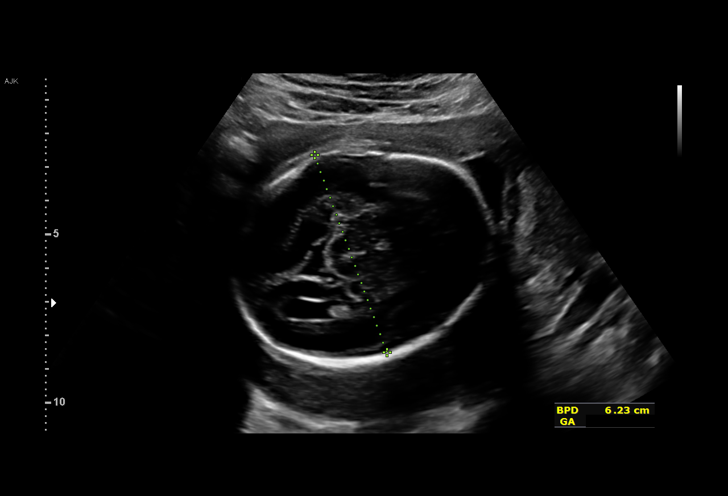
[im 9/29]
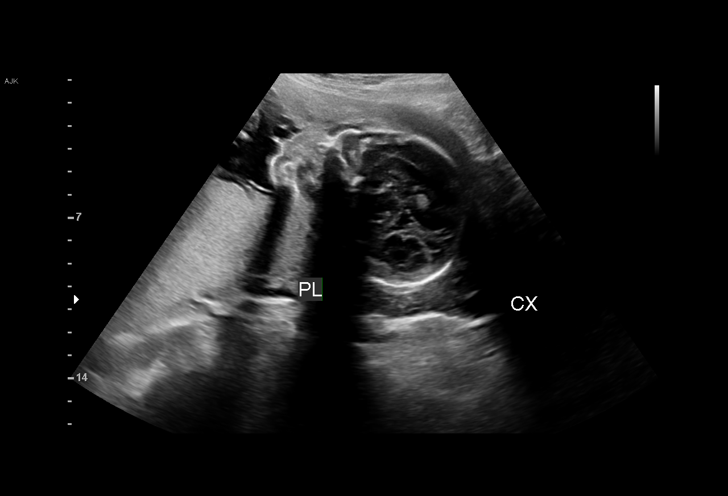
[im 12/29]
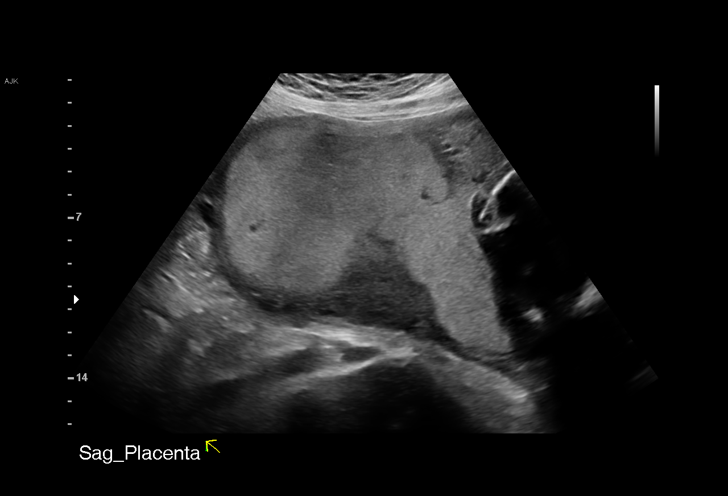
[im 15/29]
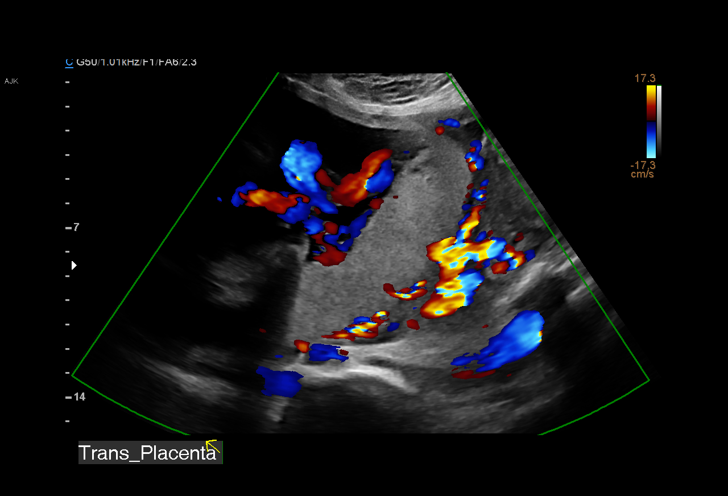
[im 17/29]
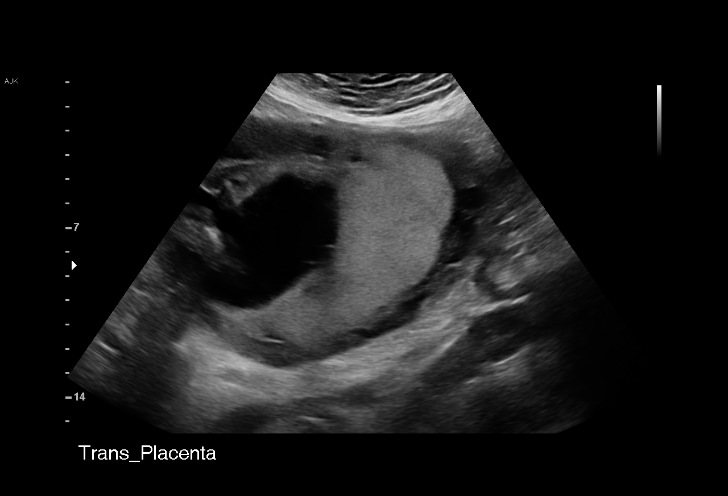
[im 20/29]
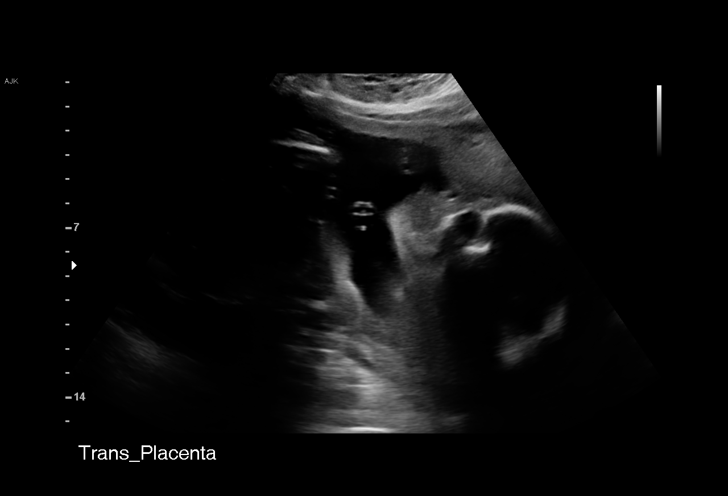
[im 22/29]
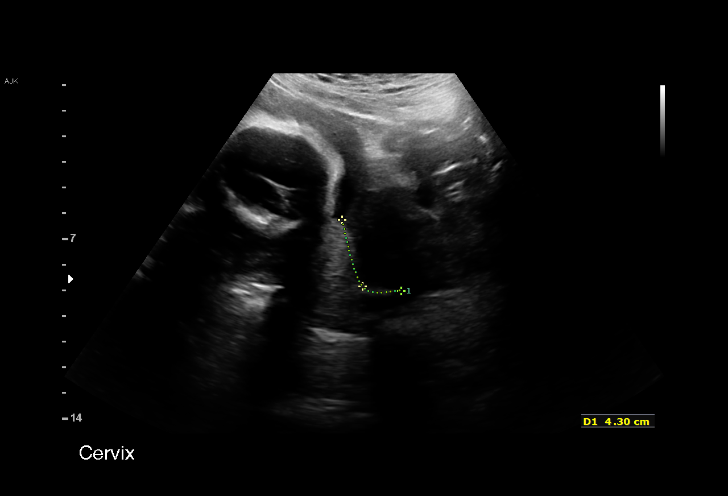
[im 25/29]
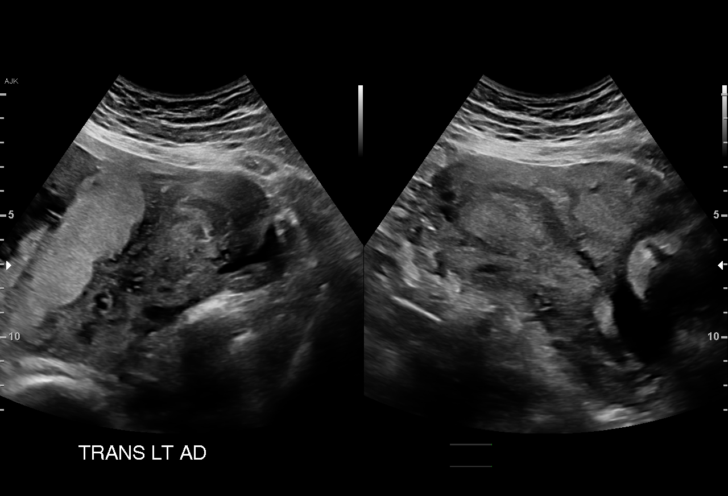
[im 27/29]
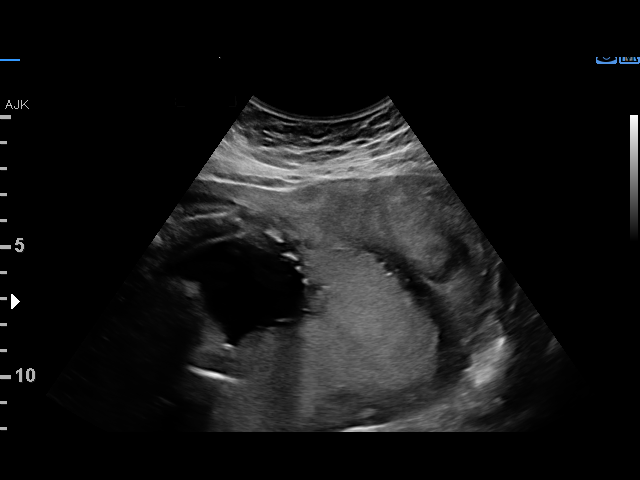

[Series 2: us mfm ob limited · 3 of 6 slices shown (2 of 2)]
[im 1/6]
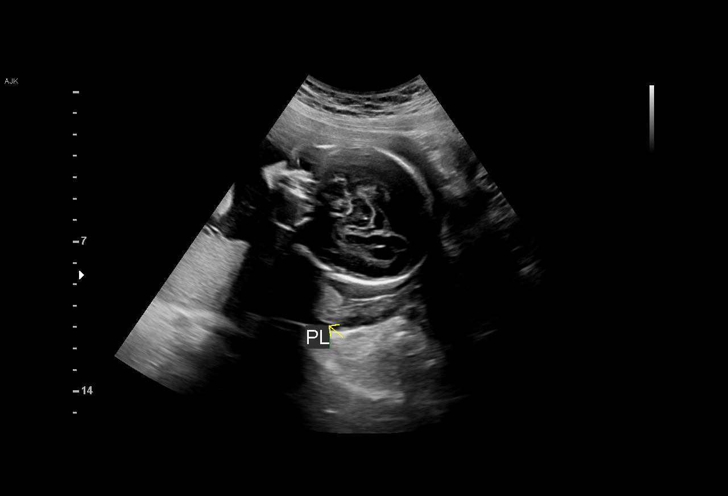
[im 3/6]
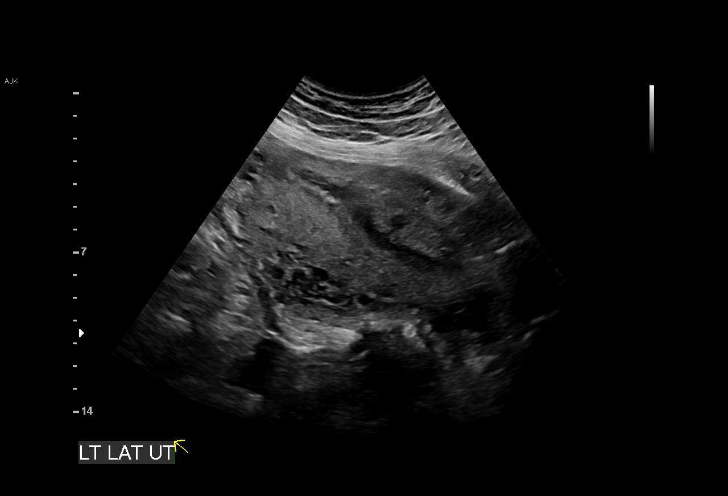
[im 6/6]
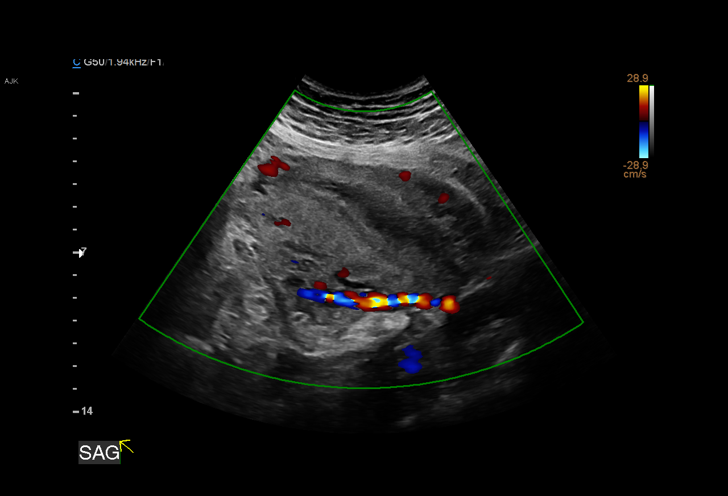

[14 of 28 positions shown; findings below may reference images not displayed]

1  US MFM OB LIMITED                    76815.01     GISH
                                                       VINCA
 ----------------------------------------------------------------------

 ----------------------------------------------------------------------
Indications

  24 weeks gestation of pregnancy
  Pelvic pain affecting pregnancy in second
  trimester
  Insufficient Prenatal Care
  Encounter for uncertain dates
  Poor obstetric history: Previous preterm
  delivery, antepartum (29w2d)
  Short interval between pregancies, 2nd
  trimester
  Obesity complicating pregnancy
 ----------------------------------------------------------------------
Vital Signs

 BMI:
Fetal Evaluation

 Num Of Fetuses:          1
 Fetal Heart Rate(bpm):   140
 Cardiac Activity:        Observed
 Presentation:            Cephalic
 Placenta:                Posterior
 P. Cord Insertion:       Visualized

 Amniotic Fluid
 AFI FV:      Within normal limits

                             Largest Pocket(cm)


 Comment:    Stomach, bladder, and diaphragm noted. No placental abruption
             or previa identified.
Biometry

 BPD:      62.3  mm     G. Age:  25w 2d         77  %
                                                         FL/BPD:      68.4  %    71 - 87
 FL:       42.6  mm     G. Age:  23w 6d         26  %
OB History

 Gravidity:    4         Term:   2        Prem:   1
Gestational Age

 LMP:           24w 2d        Date:  01/04/19                 EDD:   10/11/19
 U/S Today:     24w 4d                                        EDD:   10/09/19
 Best:          24w 2d     Det. By:  LMP  (01/04/19)          EDD:   10/11/19
Cervix Uterus Adnexa

 Cervix
 Length:           3.32  cm.
 Normal appearance by transabdominal scan.

 Uterus
 Bicornuate vs septated

 Left Ovary
 Within normal limits.

 Right Ovary
 Within normal limits.

 Adnexa
 No abnormality visualized.
Impression

 Limited exam
 Normal cervical length
 Good fetal movement and amniotic fluid
Recommendations

 Follow up as clinically indicated.

## 2020-02-02 IMAGING — US US MFM OB DETAIL+14 WK
1 series · 13 of 28 positions shown · non-contrast
Comparison: none

[Series 1: us mfm ob detail+14 wk · 13 of 63 slices shown]
[im 3/63]
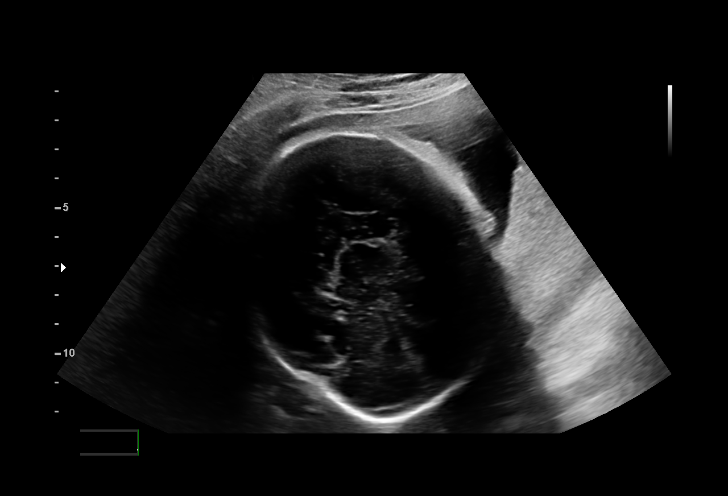
[im 7/63]
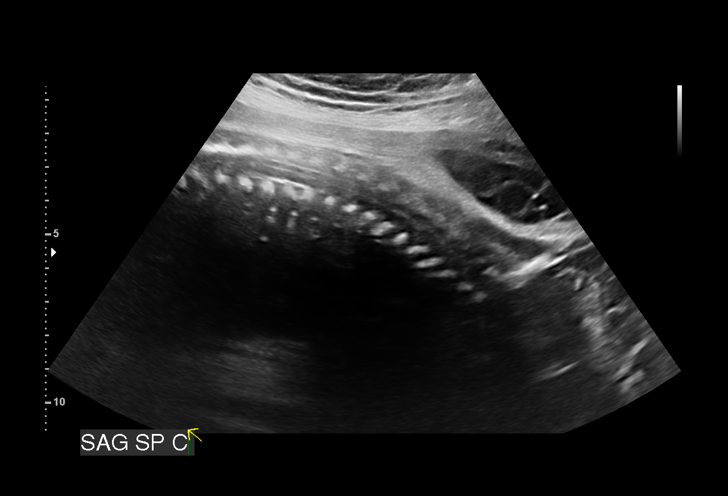
[im 12/63]
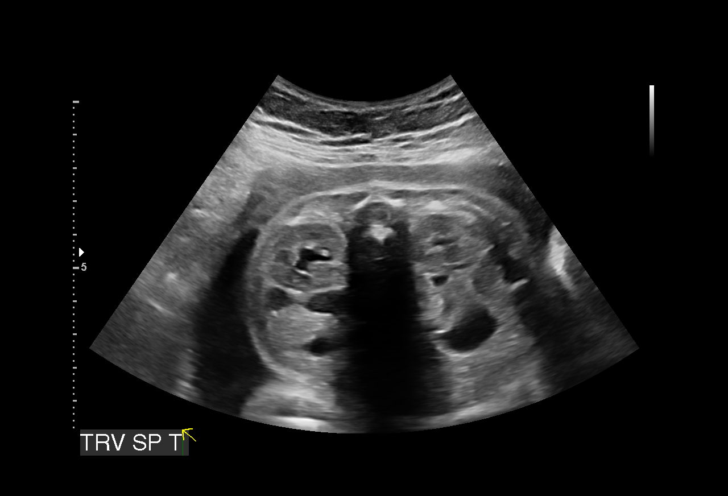
[im 17/63]
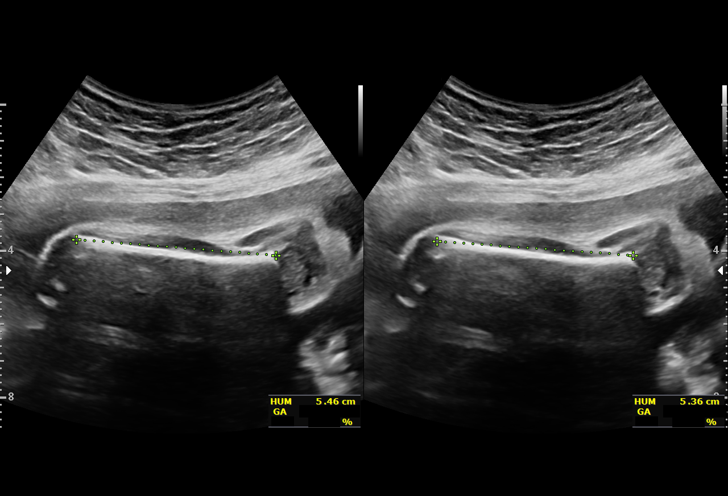
[im 21/63]
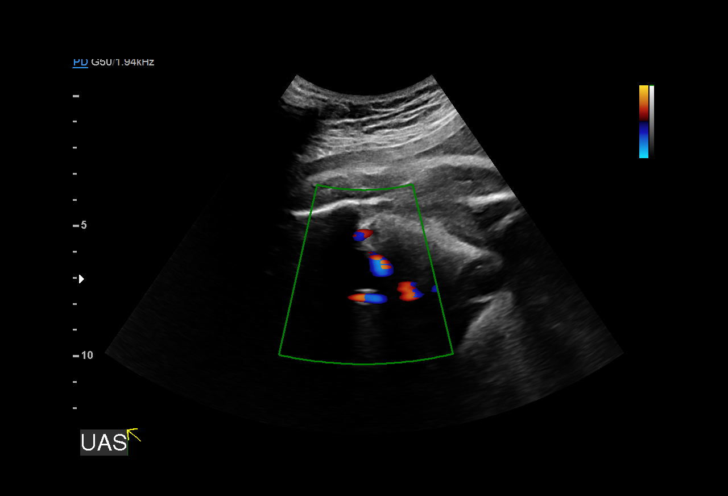
[im 26/63]
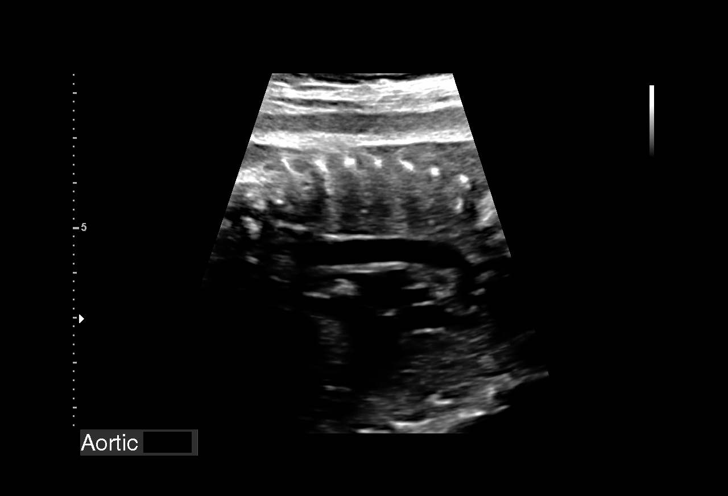
[im 33/63]
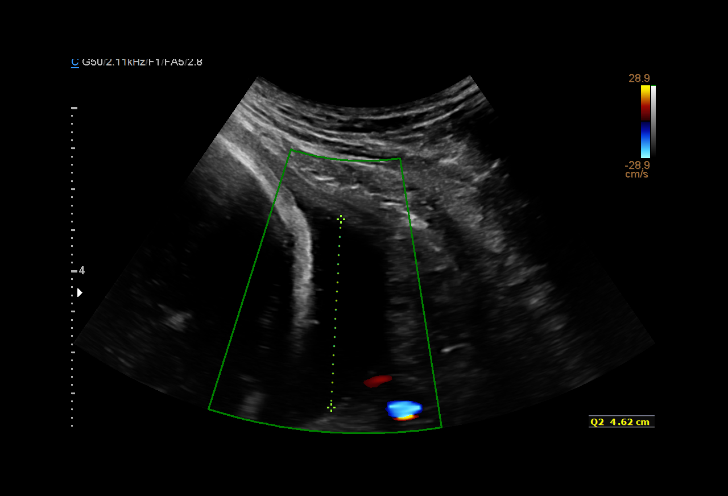
[im 37/63]
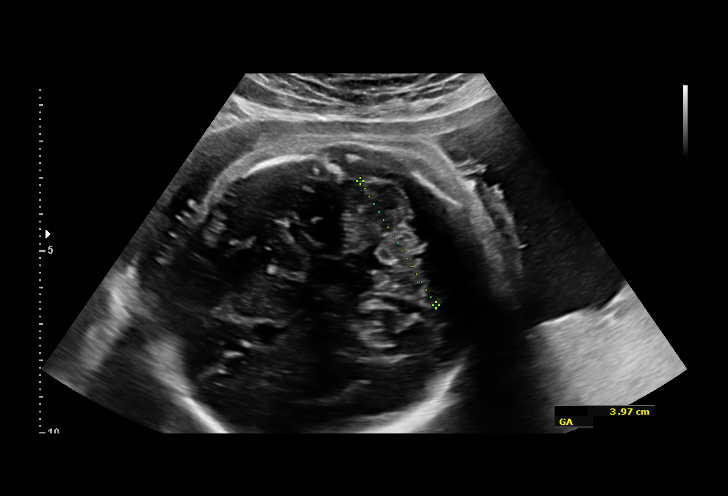
[im 42/63]
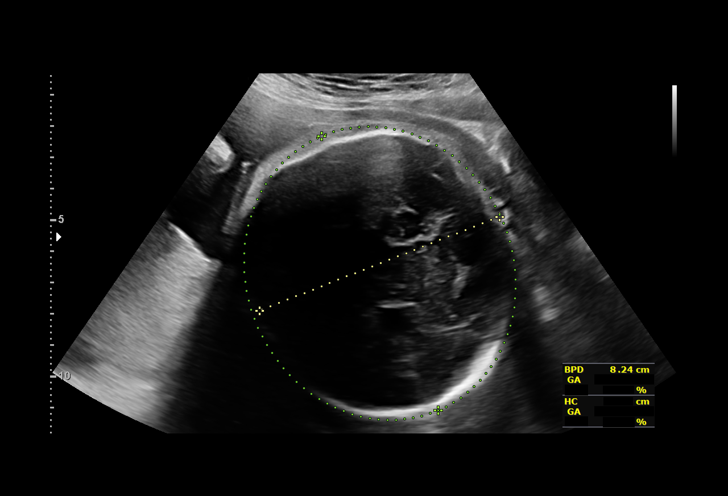
[im 46/63]
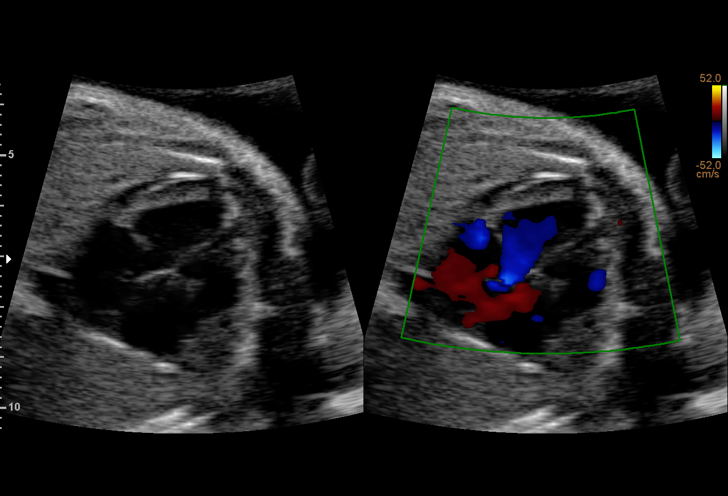
[im 51/63]
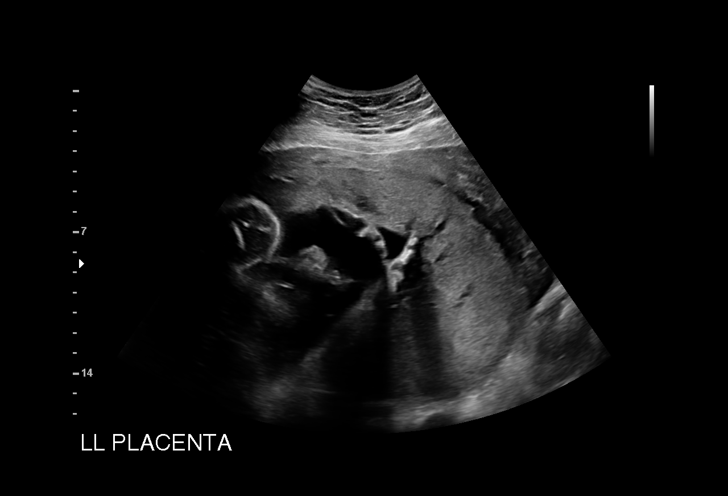
[im 56/63]
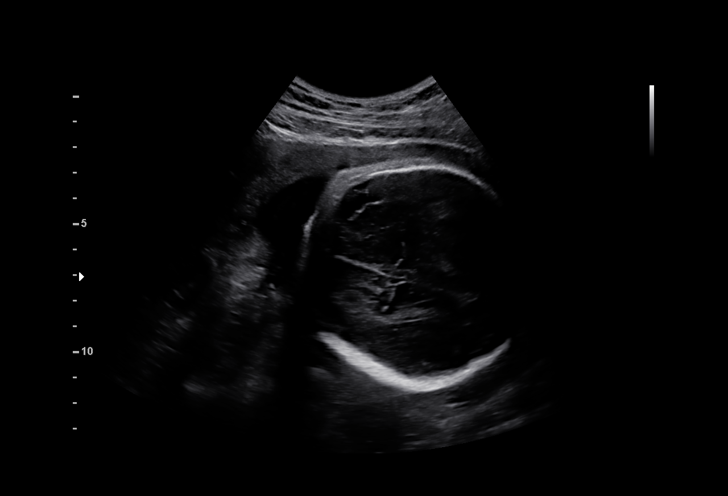
[im 60/63]
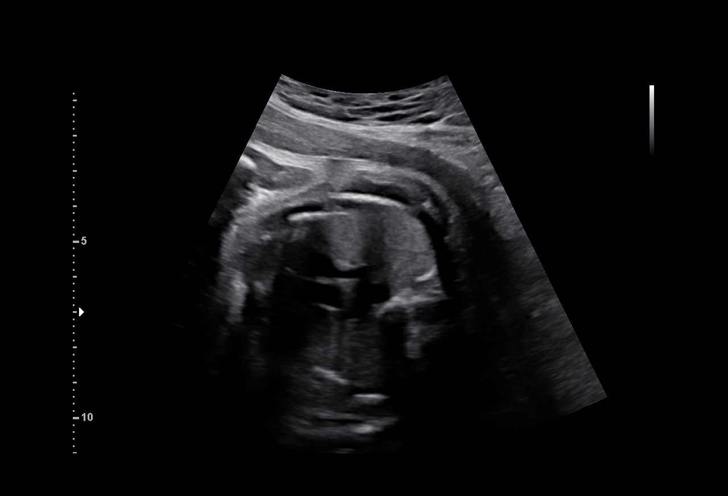

[13 of 28 positions shown; findings below may reference images not displayed]

[address]

 ----------------------------------------------------------------------

 ----------------------------------------------------------------------
Indications

  Obesity complicating pregnancy
  31 weeks gestation of pregnancy
  Insufficient Prenatal Care
  Poor obstetric history: Previous preterm
  delivery, antepartum (45w4d)
  Short interval between pregancies, 3rd
  trimester
  Encounter for antenatal screening for
  malformations (declined testing)
  Late to prenatal care, third trimester
  Poor obstetric history: Previous
  preeclampsia / eclampsia/gestational HTN
 ----------------------------------------------------------------------
Vital Signs

                                                Height:        5'5"
Fetal Evaluation

 Num Of Fetuses:         1
 Fetal Heart Rate(bpm):  154
 Cardiac Activity:       Observed
 Presentation:           Cephalic
 Placenta:               Left lateral
 P. Cord Insertion:      Visualized, central

 Amniotic Fluid
 AFI FV:      Within normal limits

 AFI Sum(cm)     %Tile       Largest Pocket(cm)
 11.14           24

 RUQ(cm)       RLQ(cm)       LUQ(cm)        LLQ(cm)
 2.41          4.11          4.62           0
Biometry

 BPD:      81.4  mm     G. Age:  32w 5d         84  %    CI:        80.02   %    70 - 86
                                                         FL/HC:      21.4   %    19.3 -
 HC:      287.5  mm     G. Age:  31w 4d         26  %    HC/AC:      1.03        0.96 -
 AC:      278.6  mm     G. Age:  31w 6d         70  %    FL/BPD:     75.6   %    71 - 87
 FL:       61.5  mm     G. Age:  31w 6d         58  %    FL/AC:      22.1   %    20 - 24
 HUM:      54.1  mm     G. Age:  31w 3d         59  %
 CER:      39.7  mm     G. Age:  33w 5d         89  %

 Est. FW:    8548  gm      4 lb 2 oz     66  %
OB History

 Gravidity:    4         Term:   2        Prem:   1
Gestational Age

 LMP:           31w 1d        Date:  01/04/19                 EDD:   10/11/19
 U/S Today:     32w 0d                                        EDD:   10/05/19
 Best:          31w 1d     Det. By:  LMP  (01/04/19)          EDD:   10/11/19
Anatomy

 Cranium:               Appears normal         LVOT:                   Not well visualized
 Cavum:                 Appears normal         Aortic Arch:            Appears normal
 Ventricles:            Appears normal         Ductal Arch:            Not well visualized
 Choroid Plexus:        Appears normal         Diaphragm:              Appears normal
 Cerebellum:            Appears normal         Stomach:                Appears normal, left
                                                                       sided
 Posterior Fossa:       Not well visualized    Abdomen:                Appears normal
 Nuchal Fold:           Not applicable (>20    Abdominal Wall:         Not well visualized
                        wks GA)
 Face:                  Not well visualized    Cord Vessels:           Appears normal (3
                                                                       vessel cord)
 Lips:                  Not well visualized    Kidneys:                Appear normal
 Palate:                Not well visualized    Bladder:                Appears normal
 Thoracic:              Appears normal         Spine:                  Appears normal
 Heart:                 Appears normal         Upper Extremities:      Visualized
                        (4CH, axis, and
                        situs)
 RVOT:                  Not well visualized    Lower Extremities:      Visualized

 Other:  Technically difficult due to advanced GA and fetal position. Hands not
         well visualized. Heels visualized.
Cervix Uterus Adnexa

 Cervix
 Not visualized (advanced GA >03wks)
Comments

 This patient was seen for a detailed fetal anatomy scan due
 to lack of prenatal care.  She denies any significant past
 medical history and denies any problems in her current
 pregnancy.
 As the patient has had only limited prenatal care, she did not
 have any screening tests for fetal aneuploidy in her current
 pregnancy.
 The fetal biometry measurements obtained today were
 consistent with an EDC October 11, 2019.  There was
 normal amniotic fluid noted today.
 The views of the fetal anatomy were limited today due to her
 advanced gestational age and maternal body habitus.
 The patient was informed that anomalies may be missed due
 to technical limitations. If the fetus is in a suboptimal position
 or maternal habitus is increased, visualization of the fetus in
 the maternal uterus may be impaired.
 A follow-up exam was scheduled in 4 weeks to assess the
 fetal growth.

## 2020-10-06 ENCOUNTER — Ambulatory Visit (HOSPITAL_COMMUNITY)
Admission: EM | Admit: 2020-10-06 | Discharge: 2020-10-06 | Disposition: A | Discharge status: Home / Self Care | Payer: Managed Care, Other (non HMO) | Attending: Urgent Care | Admitting: Urgent Care

## 2020-10-06 ENCOUNTER — Encounter (HOSPITAL_COMMUNITY): Payer: Self-pay | Admitting: *Deleted

## 2020-10-06 ENCOUNTER — Other Ambulatory Visit: Payer: Self-pay

## 2020-10-06 DIAGNOSIS — T24212A Burn of second degree of left thigh, initial encounter: Secondary | ICD-10-CM

## 2020-10-06 DIAGNOSIS — M79651 Pain in right thigh: Secondary | ICD-10-CM

## 2020-10-06 DIAGNOSIS — M79652 Pain in left thigh: Secondary | ICD-10-CM

## 2020-10-06 DIAGNOSIS — T24211A Burn of second degree of right thigh, initial encounter: Secondary | ICD-10-CM | POA: Diagnosis not present

## 2020-10-06 MED ORDER — HYDROCODONE-ACETAMINOPHEN 5-325 MG PO TABS
ORAL_TABLET | ORAL | Status: AC
  Filled 2020-10-06: qty 1

## 2020-10-06 MED ORDER — SILVER SULFADIAZINE 1 % EX CREA
TOPICAL_CREAM | CUTANEOUS | Status: AC
  Filled 2020-10-06: qty 85

## 2020-10-06 MED ORDER — SILVER SULFADIAZINE 1 % EX CREA: 1.0000 "application " | TOPICAL_CREAM | Freq: Every day | CUTANEOUS | 0 refills | Status: DC

## 2020-10-06 MED ORDER — NAPROXEN 500 MG PO TABS: 500.0000 mg | ORAL_TABLET | Freq: Two times a day (BID) | ORAL | 0 refills | Status: DC

## 2020-10-06 MED ORDER — HYDROCODONE-ACETAMINOPHEN 5-325 MG PO TABS
1.0000 | ORAL_TABLET | Freq: Once | ORAL | Status: AC
  Administered 2020-10-06: 1 via ORAL
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
## 2020-10-06 NOTE — Discharge Instructions (Signed)
Please change your dressing 2-3 times daily. Apply silvadene cream generously and secure with a non-stick dressing. Each time you change your dressing, make sure you clean gently around the perimeter of the wound with gentle soap and warm water. Do not peel off any dead skin. If it comes off in the process of washing your wound or removing the dressing, that's okay as long as you are cleaning your wounds gently. Pat your wound dry and let it air out if possible for an hour before reapplying another dressing. Once your new skin develops in ~2 weeks and the scabs have come off then you no longer have to apply the cream and dressings. Use naproxen twice daily with food as needed for pain control.

## 2022-02-08 ENCOUNTER — Encounter (HOSPITAL_COMMUNITY): Payer: Self-pay

## 2022-02-08 ENCOUNTER — Ambulatory Visit (HOSPITAL_COMMUNITY)
Admission: EM | Admit: 2022-02-08 | Discharge: 2022-02-08 | Disposition: A | Discharge status: Home / Self Care | Payer: Managed Care, Other (non HMO) | Attending: Physician Assistant | Admitting: Physician Assistant

## 2022-02-08 DIAGNOSIS — M542 Cervicalgia: Secondary | ICD-10-CM | POA: Diagnosis not present

## 2022-02-08 DIAGNOSIS — R221 Localized swelling, mass and lump, neck: Secondary | ICD-10-CM | POA: Diagnosis not present

## 2022-02-08 DIAGNOSIS — M79602 Pain in left arm: Secondary | ICD-10-CM

## 2022-02-08 DIAGNOSIS — G4489 Other headache syndrome: Secondary | ICD-10-CM | POA: Diagnosis not present

## 2022-02-08 NOTE — Discharge Instructions (Addendum)
I have made an internal referral to the ENT specialist for evaluation of the neck mass.  The office should be calling you within the next 48 hours to set up an appointment.  If you do not hear from them by Monday at noon then please call the number provided on the sheet in order to get an appointment to be evaluated. If the area enlarges over the next 2 to 3 days then it is advised that she report to the emergency room for evaluation.

## 2022-02-08 NOTE — ED Provider Notes (Signed)
[place]    CSN: [id number] Arrival date & time: 02/08/22  1412      History   Chief Complaint Chief Complaint  Patient presents with   neck swelling    HPI Stephanie Holmes is a 31 y.o. female.   31 year old female presents with swelling on the left side of the neck, headache, left arm pain.  She relates for the past 4 months she has been having increasing swelling on the left side of the neck with mild tenderness and pain associated.  Patient also indicates that she is having intermittent headaches on the left side and intermittent left arm numbness and tingling.  Patient relates the size of the swelling has increased over the past 2 months.  Denies any trauma to the area, she is not having any fever or chills, difficulty swallowing, nausea or vomiting.  Patient is concerned because she does not know what is causing the swelling.     Past Medical History:  Diagnosis Date   Group B Streptococcus carrier, antepartum 02/16/2018   Late prenatal care 12/12/2017   NSVD (normal spontaneous vaginal delivery) 02/16/2018   Pregnancy induced hypertension    Preterm labor    Proteinuria affecting pregnancy 12/19/2017   Unsure if baseline. PCR ratio 370. No elevated BPs. H/o PPH.    Supervision of high risk pregnancy in second trimester 12/12/2017    Nursing Staff Provider Office Location  [facility] Dating   LMP c/w late 2nd trimester [place] Language  Pohnpeian Anatomy US   Normal Flu Vaccine   Genetic Screen  NIPS low risk  TDaP vaccine  12/26/17  Hgb A1C or  GTT Early too late Third trimester: normal Rhogam   N/A   LAB RESULTS  Feeding Plan Breast/Bottle Blood Type A/Positive/-- (05/10 1008)  Contraception unsure Antibody Negative (05/10 1008) Circumc    Patient Active Problem List   Diagnosis Date Noted   Labor and delivery, indication for care 09/27/2019   Supervision of high risk pregnancy, antepartum 07/05/2019   History of pre-eclampsia in prior pregnancy, currently pregnant 12/12/2017    History of preterm labor 12/12/2017   History of postpartum hemorrhage 12/12/2017   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 Authorizing Provider  aspirin EC 81 MG tablet Take 1 tablet (81 mg total) by mouth daily. Take after 12 weeks for prevention of preeclampsia later in pregnancy 06/23/19   Darlina Rumpf, CNM  ibuprofen (ADVIL) 600 MG tablet Take 1 tablet (600 mg total) by mouth every 6 (six) hours. 09/29/19   Laury Deep, CNM  naproxen (NAPROSYN) 500 MG tablet Take 1 tablet (500 mg total) by mouth 2 (two) times daily with a meal. 10/06/20   Jaynee Eagles, PA-C  Prenatal Vit-Fe Fumarate-FA (PREPLUS) 27-1 MG TABS Take 1 tablet by  mouth daily. 06/23/19   Darlina Rumpf, CNM  silver sulfADIAZINE (SILVADENE) 1 % cream Apply 1 application topically daily. 10/06/20   Jaynee Eagles, PA-C    Family History Family History  Problem Relation Age of Onset   Healthy Mother    Diabetes Father    Hypertension Father     Social History Social History   Tobacco Use   Smoking status: Never   Smokeless tobacco: Never  Vaping Use   Vaping Use: Never used  Substance Use Topics   Alcohol use: No   Drug use: Never     Allergies   Patient has no known allergies.   Review of Systems Review of Systems  HENT:  Positive for facial swelling (left side of neck swelling).      Physical Exam Triage Vital Signs ED Triage Vitals [02/08/22 1437]  Enc Vitals Group     BP 122/68     Pulse Rate 94     Resp 16     Temp 98.6 F (37 C)     Temp Source Oral     SpO2 98 %      Weight      Height      Head Circumference      Peak Flow      Pain Score 4     Pain Loc      Pain Edu?      Excl. in [place]?    No data found.  Updated Vital Signs BP 122/68 (BP Location: Left Arm)   Pulse 94   Temp 98.6 F (37 C) (Oral)   Resp 16   LMP 02/02/2022 (Exact Date)   SpO2 98%   Visual Acuity Right Eye Distance:   Left Eye Distance:   Bilateral Distance:    Right Eye Near:   Left Eye Near:    Bilateral Near:     Physical Exam Constitutional:      Appearance: Normal appearance.  HENT:     Right Ear: Tympanic membrane and ear canal normal.     Left Ear: Tympanic membrane and ear canal normal.     Mouth/Throat:     Mouth: Mucous membranes are moist.     Pharynx: Oropharynx is clear. No pharyngeal swelling.  Neck:      Comments: Neck: The right side of the neck is normal without any adenopathy present.  The left side of the neck has a 4 cm x 4 cm solid mass which is at the supra clavicular area, fixed mass. there is mild tenderness on palpation of the site, there is no redness associated. Neurological:     Mental Status: She is alert.      UC Treatments / Results  Labs (all labs ordered are listed, but only abnormal results are displayed) Labs Reviewed - No data to display  EKG   Radiology No results found.  Procedures Procedures (including critical care time)  Medications Ordered in UC Medications - No data to display  Initial Impression / Assessment and Plan / UC Course  I have reviewed the triage vital signs and the nursing notes.  Pertinent labs & imaging results that were available during my care of the patient were reviewed by me and considered in my medical decision making (see chart for details).    Plan: 1.  The patient has been internally referred to ENT for evaluation of the Left neck mass. 2.  Patient has been advised to report to the emergency room for evaluation if  she starts having any difficulty swallowing, breathing, fever  or increase in symptoms. 3.  Advised to follow-up urgent care as needed. Final Clinical Impressions(s) / UC Diagnoses   Final diagnoses:  Neck pain  Other headache syndrome  Left arm pain  Mass of left side of neck     Discharge Instructions      I have made an internal referral to the ENT specialist for evaluation of the neck mass.  The office should be calling you within the next 48 hours to set up an appointment.  If you do not hear from them by Monday at noon then please call the number provided on the sheet in order to get an appointment to be evaluated. If the area enlarges over the next 2 to 3 days then it is advised that she report to the emergency room for evaluation.    ED Prescriptions   None    PDMP not reviewed this encounter.   Nyoka Lint, PA-C 02/08/22 1517

## 2022-02-08 NOTE — ED Triage Notes (Signed)
Pt states pain and swelling to left side of neck for the past month. States the left side of her head and left arm hurt as well.

## 2022-02-12 ENCOUNTER — Emergency Department (HOSPITAL_COMMUNITY): Payer: Managed Care, Other (non HMO)

## 2022-02-12 ENCOUNTER — Other Ambulatory Visit: Payer: Self-pay

## 2022-02-12 ENCOUNTER — Encounter (HOSPITAL_COMMUNITY): Payer: Self-pay | Admitting: Emergency Medicine

## 2022-02-12 ENCOUNTER — Emergency Department (HOSPITAL_COMMUNITY)
Admission: EM | Admit: 2022-02-12 | Discharge: 2022-02-13 | Disposition: A | Discharge status: Home / Self Care | Payer: Managed Care, Other (non HMO) | Attending: Emergency Medicine | Admitting: Emergency Medicine

## 2022-02-12 DIAGNOSIS — R221 Localized swelling, mass and lump, neck: Secondary | ICD-10-CM | POA: Insufficient documentation

## 2022-02-12 DIAGNOSIS — R200 Anesthesia of skin: Secondary | ICD-10-CM | POA: Diagnosis not present

## 2022-02-12 DIAGNOSIS — Z7982 Long term (current) use of aspirin: Secondary | ICD-10-CM | POA: Insufficient documentation

## 2022-02-12 LAB — CBC WITH DIFFERENTIAL/PLATELET
Abs Immature Granulocytes: 0.02 10*3/uL (ref 0.00–0.07)
Basophils Absolute: 0.1 10*3/uL (ref 0.0–0.1)
Basophils Relative: 1 %
Eosinophils Absolute: 0.5 10*3/uL (ref 0.0–0.5)
Eosinophils Relative: 6 %
HCT: 40.3 % (ref 36.0–46.0)
Hemoglobin: 12.3 g/dL (ref 12.0–15.0)
Immature Granulocytes: 0 %
Lymphocytes Relative: 28 %
Lymphs Abs: 2.6 10*3/uL (ref 0.7–4.0)
MCH: 25.2 pg — ABNORMAL LOW (ref 26.0–34.0)
MCHC: 30.5 g/dL (ref 30.0–36.0)
MCV: 82.4 fL (ref 80.0–100.0)
Monocytes Absolute: 0.6 10*3/uL (ref 0.1–1.0)
Monocytes Relative: 7 %
Neutro Abs: 5.3 10*3/uL (ref 1.7–7.7)
Neutrophils Relative %: 58 %
Platelets: 321 10*3/uL (ref 150–400)
RBC: 4.89 MIL/uL (ref 3.87–5.11)
RDW: 13.5 % (ref 11.5–15.5)
WBC: 9.1 10*3/uL (ref 4.0–10.5)
nRBC: 0 % (ref 0.0–0.2)

## 2022-02-12 LAB — COMPREHENSIVE METABOLIC PANEL
ALT: 14 U/L (ref 0–44)
AST: 15 U/L (ref 15–41)
Albumin: 3.5 g/dL (ref 3.5–5.0)
Alkaline Phosphatase: 54 U/L (ref 38–126)
Anion gap: 6 (ref 5–15)
BUN: 9 mg/dL (ref 6–20)
CO2: 26 mmol/L (ref 22–32)
Calcium: 8.5 mg/dL — ABNORMAL LOW (ref 8.9–10.3)
Chloride: 105 mmol/L (ref 98–111)
Creatinine, Ser: 0.73 mg/dL (ref 0.44–1.00)
GFR, Estimated: >60 mL/min (ref >60)
Glucose, Bld: 96 mg/dL (ref 70–99)
Potassium: 3.9 mmol/L (ref 3.5–5.1)
Sodium: 137 mmol/L (ref 135–145)
Total Bilirubin: 0.2 mg/dL — ABNORMAL LOW (ref 0.3–1.2)
Total Protein: 7 g/dL (ref 6.5–8.1)

## 2022-02-12 LAB — I-STAT BETA HCG BLOOD, ED (MC, WL, AP ONLY): I-stat hCG, quantitative: <5 m[IU]/mL (ref <5)

## 2022-02-12 MED ORDER — IOHEXOL 300 MG/ML  SOLN
100.0000 mL | Freq: Once | INTRAMUSCULAR | Status: AC | PRN
  Administered 2022-02-12: 100 mL via INTRAVENOUS

## 2022-02-12 NOTE — ED Provider Triage Note (Signed)
Emergency Medicine Provider Triage Evaluation Note  Stephanie Holmes , a 31 y.o. female  was evaluated in triage.  Pt complains of mass on the left side of her neck.  She states this has been present for over 2 months and has been constantly growing.  It was not initially painful but now is painful.  She reports she has been unintentionally losing weight recently.    Physical Exam  BP 116/73 (BP Location: Right Arm)   Pulse 78   Temp 98.5 F (36.9 C) (Oral)   Resp 16   LMP 02/02/2022 (Exact Date)   SpO2 100%  Gen:   Awake, no distress   Resp:  Normal effort  MSK:   Moves extremities without difficulty  Other:  2+ left radial pulse. There is a obvious large supraclavicular left sided neck/chest mass.  This does not have significant surrounding induration, fluctuance, or erythema.   Medical Decision Making  Medically screening exam initiated at 6:53 PM.  Appropriate orders placed.  Stephanie Holmes was informed that the remainder of the evaluation will be completed by another provider, this initial triage assessment does not replace that evaluation, and the importance of remaining in the ED until their evaluation is complete.  Patient reports unintentional weight loss, has a large left-sided supraclavicular mass. Clinically I am concerned that this may represent a lymphoma or another malignancy. We will check basic labs, obtain contrasted CT scan of the neck and chest given the location.    Stephanie Holmes, [place] 02/12/22 1858

## 2022-02-12 NOTE — ED Triage Notes (Signed)
Pt reports lump to left neck for the last 2 months. Pt reports headache and pain with range of motion of left arm. Denies recent fever. VSS.

## 2022-02-13 NOTE — Discharge Instructions (Signed)
Your CT scan is concerning for a mass on the anterior neck.  You need to have a biopsy or a procedure to take a piece of tissue from the mass to understand better what it is.  This can be done by ENT or interventional radiology.  ENT follow-up provided above.

## 2022-02-13 NOTE — ED Provider Notes (Signed)
[facility] EMERGENCY DEPARTMENT Provider Note   CSN: [id number] Arrival date & time: 02/12/22  1737     History  Chief Complaint  Patient presents with   Arm Pain   Neck Pain    Stephanie Holmes is a 31 y.o. female.  HPI     This is a 31 year old female who presents with a neck mass.  Patient's family is at the bedside.  They served as primary interpreter as I do not have access to an interpreter for her preferred language.  Reports 26-monthhistory of left-sided neck mass and pain.  She has had pain that radiates into the left arm.  Has noted some numbness but no weakness.  She has not seen anybody for the pain.  Denies fevers.  Denies difficulty swallowing.  Home Medications Prior to Admission medications   Medication Sig Start Date End Date Taking? Authorizing Provider  aspirin EC 81 MG tablet Take 1 tablet (81 mg total) by mouth daily. Take after 12 weeks for prevention of preeclampsia later in pregnancy 06/23/19   WDarlina Rumpf CNM  ibuprofen (ADVIL) 600 MG tablet Take 1 tablet (600 mg total) by mouth every 6 (six) hours. 09/29/19   DLaury Deep CNM  naproxen (NAPROSYN) 500 MG tablet Take 1 tablet (500 mg total) by mouth 2 (two) times daily with a meal. 10/06/20   MJaynee Eagles PA-C  Prenatal Vit-Fe Fumarate-FA (PREPLUS) 27-1 MG TABS Take 1 tablet by mouth daily. 06/23/19   WDarlina Rumpf CNM  silver sulfADIAZINE (SILVADENE) 1 % cream Apply 1 application topically daily. 10/06/20   MJaynee Eagles PA-C      Allergies    Patient has no known allergies.    Review of Systems   Review of Systems  Constitutional:  Negative for fever.  HENT:  Negative for trouble swallowing and voice change.   Musculoskeletal:        Neck swelling  Neurological:  Positive for numbness.  All other systems reviewed and are negative.   Physical Exam Updated Vital Signs BP (!) 108/59 (BP Location: Right Arm)   Pulse 72   Temp 98 F (36.7 C) (Oral)   Resp 15   LMP  02/02/2022 (Exact Date)   SpO2 100%  Physical Exam Vitals and nursing note reviewed.  Constitutional:      Appearance: She is well-developed. She is not ill-appearing.  HENT:     Head: Normocephalic and atraumatic.     Nose: Nose normal.     Mouth/Throat:     Mouth: Mucous membranes are moist.  Eyes:     Pupils: Pupils are equal, round, and reactive to light.  Neck:     Comments: Large firm mass just above the left clavicle, none mobile Cardiovascular:     Rate and Rhythm: Normal rate and regular rhythm.     Heart sounds: Normal heart sounds.  Pulmonary:     Effort: Pulmonary effort is normal. No respiratory distress.     Breath sounds: No wheezing.  Abdominal:     Palpations: Abdomen is soft.  Musculoskeletal:     Cervical back: Normal range of motion and neck supple.  Skin:    General: Skin is warm and dry.  Neurological:     Mental Status: She is alert and oriented to person, place, and time.     Comments: 5 out of 5 strength with grip, bicep, tricep, deltoid bilaterally  Psychiatric:        Mood and Affect: Mood normal.  ED Results / Procedures / Treatments   Labs (all labs ordered are listed, but only abnormal results are displayed) Labs Reviewed  COMPREHENSIVE METABOLIC PANEL - Abnormal; Notable for the following components:      Result Value   Calcium 8.5 (*)    Total Bilirubin 0.2 (*)    All other components within normal limits  CBC WITH DIFFERENTIAL/PLATELET - Abnormal; Notable for the following components:   MCH 25.2 (*)    All other components within normal limits  I-STAT BETA HCG BLOOD, ED ([facility], [facility], [facility] ONLY)    EKG None  Radiology CT Chest W Contrast  Result Date: 02/12/2022 CLINICAL DATA:  Patient complains of mass on the left side of her neck. EXAM: CT CHEST WITH CONTRAST TECHNIQUE: Multidetector CT imaging of the chest was performed during intravenous contrast administration. RADIATION DOSE REDUCTION: This exam was performed according to the  departmental dose-optimization program which includes automated exposure control, adjustment of the mA and/or kV according to patient size and/or use of iterative reconstruction technique. CONTRAST:  158m OMNIPAQUE IOHEXOL 300 MG/ML  SOLN COMPARISON:  None Available. FINDINGS: Cardiovascular: No significant vascular findings. Normal heart size. No pericardial effusion. Mediastinum/Nodes: No enlarged mediastinal, hilar, or axillary lymph nodes. Thyroid gland, trachea, and esophagus demonstrate no significant findings. Lungs/Pleura: Lungs are clear. No pleural effusion or pneumothorax. Upper Abdomen: No acute abnormality. Musculoskeletal: A 5.1 cm x 4.1 cm x 4.7 cm well-defined homogeneous soft tissue mass (approximately 29.64 Hounsfield units) is seen within the chest wall/neck, just above the level of the left apex (axial CT images 1 through 16, CT series 3). This is located in between the posterior aspect of the left clavicle and anterolateral paravertebral soft tissues of C7 and extends inferiorly along the anterior aspect of the first left rib. No associated cortical destruction or pulmonary involvement is noted. No acute osseous abnormalities are identified. IMPRESSION: Soft tissue mass just above the level of the left apex, as described above, which corresponds to the area of palpable abnormality noted by the patient on physical examination. Further evaluation with contrast enhanced neck CT and/or MRI is recommended to exclude the presence of an underlying neoplasm. Electronically Signed   By: TVirgina NorfolkM.D.   On: 02/12/2022 22:28    Procedures Procedures    Medications Ordered in ED Medications  iohexol (OMNIPAQUE) 300 MG/ML solution 100 mL (100 mLs Intravenous Contrast Given 02/12/22 2210)    ED Course/ Medical Decision Making/ A&P Clinical Course as of 02/13/22 0045  Wed Feb 13, 2022  0036 CT scan independently reviewed.  Soft tissue mass noted over the left inferior neck.  Per  radiology concerning for primary lymphoproliferative disease versus nodal metastasis.  We will consult with ENT regarding next steps for tissue diagnosis. [CH]  [id number] with Dr. BJanace Hoard ENT.  Will provide patient with outpatient follow-up with ENT.  Biopsy could likely be done by ENT or interventional radiology.  Discussed this with the patient and her family member. [CH]    Clinical Course User Index [CH] Danney Bungert, CBarbette Hair MD                           Medical Decision Making  This patient presents to the ED for concern of neck swelling, this involves an extensive number of treatment options, and is a complaint that carries with it a high risk of complications and morbidity.  I considered the following differential and admission for this acute,  potentially life threatening condition.  The differential diagnosis includes mass, vascular process, abscess  MDM:    This is a 31 year old female who presents with an enlarging mass over the left inferior anterior neck.  She is nontoxic and vital signs are reassuring.  She is neurologically intact but does describe paresthesias and numbness of the left arm.  Question whether she may have some brachial plexus impingement.  Clinically she has a palpable mass of the neck.  CT scan obtained and is concerning for lymphoproliferative versus nodal metastasis.  See clinical course above.  She will need tissue diagnosis.  Discussed this with the patient and her family member.  (Labs, imaging, consults)  Labs: I Ordered, and personally interpreted labs.  The pertinent results include: CBC, BMP  Imaging Studies ordered: I ordered imaging studies including CT neck, chest x-ray I independently visualized and interpreted imaging. I agree with the radiologist interpretation  Additional history obtained from family at bedside.  External records from outside source obtained and reviewed including prior evaluation  Cardiac Monitoring: The patient was maintained  on a cardiac monitor.  I personally viewed and interpreted the cardiac monitored which showed an underlying rhythm of: Normal sinus  Reevaluation: After the interventions noted above, I reevaluated the patient and found that they have :stayed the same  Social Determinants of Health: Lives independently  Disposition: Discharge  Co morbidities that complicate the patient evaluation  Past Medical History:  Diagnosis Date   Group B Streptococcus carrier, antepartum 02/16/2018   Late prenatal care 12/12/2017   NSVD (normal spontaneous vaginal delivery) 02/16/2018   Pregnancy induced hypertension    Preterm labor    Proteinuria affecting pregnancy 12/19/2017   Unsure if baseline. PCR ratio 370. No elevated BPs. H/o PPH.    Supervision of high risk pregnancy in second trimester 12/12/2017    Nursing Staff Provider Office Location  [facility] Dating   LMP c/w late 2nd trimester [place] Language  Pohnpeian Anatomy US   Normal Flu Vaccine   Genetic Screen  NIPS low risk  TDaP vaccine  12/26/17  Hgb A1C or  GTT Early too late Third trimester: normal Rhogam   N/A   LAB RESULTS  Feeding Plan Breast/Bottle Blood Type A/Positive/-- (05/10 1008)  Contraception unsure Antibody Negative (05/10 1008) Circumc     Medicines Meds ordered this encounter  Medications   iohexol (OMNIPAQUE) 300 MG/ML solution 100 mL    I have reviewed the patients home medicines and have made adjustments as needed  Problem List / ED Course: Problem List Items Addressed This Visit   None Visit Diagnoses     Neck mass    -  Primary                   Final Clinical Impression(s) / ED Diagnoses Final diagnoses:  Neck mass    Rx / DC Orders ED Discharge Orders     None         Merryl Hacker, MD 02/13/22 [phone]

## 2022-02-19 ENCOUNTER — Other Ambulatory Visit (HOSPITAL_COMMUNITY): Payer: Self-pay | Admitting: Otolaryngology

## 2022-02-19 ENCOUNTER — Other Ambulatory Visit: Payer: Self-pay | Admitting: Otolaryngology

## 2022-02-19 DIAGNOSIS — R590 Localized enlarged lymph nodes: Secondary | ICD-10-CM

## 2022-02-20 NOTE — Progress Notes (Unsigned)
Mir, Paula Libra, MD  Donita Brooks D Approved for US guided biopsy of left neck mass.   Mir

## 2022-02-28 ENCOUNTER — Other Ambulatory Visit: Payer: Self-pay | Admitting: Radiology

## 2022-02-28 DIAGNOSIS — R221 Localized swelling, mass and lump, neck: Secondary | ICD-10-CM

## 2022-02-28 NOTE — H&P (Signed)
Chief Complaint: Patient was seen in consultation today for cervical adenopathy  at the request of Petrey A  Referring Physician(s): [place] A  Supervising Physician: Corrie Mckusick  Patient Status: [facility] - Out-pt  History of Present Illness: Stephanie Holmes is a 31 y.o. female with PMH with no significant medical history presented to ED 02/12/22 c/o left neck mass x 2 months. Pt was referred to ENT who has referred pt to IR for biopsy. Dr. Dwaine Gale approved left neck mass biopsy procedure.   Past Medical History:  Diagnosis Date   Group B Streptococcus carrier, antepartum 02/16/2018   Late prenatal care 12/12/2017   NSVD (normal spontaneous vaginal delivery) 02/16/2018   Pregnancy induced hypertension    Preterm labor    Proteinuria affecting pregnancy 12/19/2017   Unsure if baseline. PCR ratio 370. No elevated BPs. H/o PPH.    Supervision of high risk pregnancy in second trimester 12/12/2017    Nursing Staff Provider Office Location  [facility] Dating   LMP c/w late 2nd trimester [place] Language  Pohnpeian Anatomy US   Normal Flu Vaccine   Genetic Screen  NIPS low risk  TDaP vaccine  12/26/17  Hgb A1C or  GTT Early too late Third trimester: normal Rhogam   N/A   LAB RESULTS  Feeding Plan Breast/Bottle Blood Type A/Positive/-- (05/10 1008)  Contraception unsure Antibody Negative (05/10 1008) Circumc    Past Surgical History:  Procedure Laterality Date   NO PAST SURGERIES      Allergies: Patient has no known allergies.  Medications: Prior to Admission medications   Medication Sig Start Date End Date Taking? Authorizing Provider  aspirin EC 81 MG tablet Take 1 tablet (81 mg total) by mouth daily. Take after 12 weeks for prevention of preeclampsia later in pregnancy 06/23/19   Darlina Rumpf, CNM  ibuprofen (ADVIL) 600 MG tablet Take 1 tablet (600 mg total) by mouth every 6 (six) hours. 09/29/19   Laury Deep, CNM  naproxen (NAPROSYN) 500 MG tablet Take 1 tablet (500 mg  total) by mouth 2 (two) times daily with a meal. 10/06/20   Jaynee Eagles, PA-C  Prenatal Vit-Fe Fumarate-FA (PREPLUS) 27-1 MG TABS Take 1 tablet by mouth daily. 06/23/19   Darlina Rumpf, CNM  silver sulfADIAZINE (SILVADENE) 1 % cream Apply 1 application topically daily. 10/06/20   Jaynee Eagles, PA-C     Family History  Problem Relation Age of Onset   Healthy Mother    Diabetes Father    Hypertension Father     Social History   Socioeconomic History   Marital status: Married    Spouse name: Not on file   Number of children: Not on file   Years of education: Not on file   Highest education level: Not on file  Occupational History   Not on file  Tobacco Use   Smoking status: Never   Smokeless tobacco: Never  Vaping Use   Vaping Use: Never used  Substance and Sexual Activity   Alcohol use: No   Drug use: Never   Sexual activity: Yes    Birth control/protection: None  Other Topics Concern   Not on file  Social History Narrative   Not on file   Social Determinants of Health   Financial Resource Strain: Not on file  Food Insecurity: Not on file  Transportation Needs: Not on file  Physical Activity: Not on file  Stress: Not on file  Social Connections: Unknown (06/23/2019)   Social Connection and Isolation Panel [  NHANES]    Frequency of Communication with Friends and Family: Not on file    Frequency of Social Gatherings with Friends and Family: Never    Attends Religious Services: Not on Advertising copywriter or Organizations: Not on file    Attends Archivist Meetings: Not on file    Marital Status: Not on file    Review of Systems: A 12 point ROS discussed and pertinent positives are indicated in the HPI above.  All other systems are negative.  Review of Systems  Constitutional:  Negative for fever.  Respiratory:  Negative for shortness of breath.   Cardiovascular:  Negative for chest pain.  Neurological:  Negative for headaches.   Hematological:  Positive for adenopathy.    Vital Signs: BP 128/71   Pulse 79   Temp 98.2 F (36.8 C) (Oral)   Resp 16   Ht '5\' 5"'$  (1.651 m)   Wt 209 lb (94.8 kg)   LMP 02/02/2022 (Exact Date)   BMI 34.78 kg/m     Physical Exam Vitals reviewed.  Constitutional:      General: She is not in acute distress.    Appearance: Normal appearance. She is not ill-appearing.  HENT:     Head: Normocephalic and atraumatic.     Mouth/Throat:     Mouth: Mucous membranes are dry.     Pharynx: Oropharynx is clear.  Cardiovascular:     Rate and Rhythm: Normal rate and regular rhythm.     Pulses: Normal pulses.     Heart sounds: Normal heart sounds.  Pulmonary:     Effort: Pulmonary effort is normal. No respiratory distress.     Breath sounds: Normal breath sounds.  Abdominal:     General: Bowel sounds are normal. There is no distension.     Palpations: Abdomen is soft.     Tenderness: There is no abdominal tenderness. There is no guarding.  Musculoskeletal:     Right lower leg: No edema.     Left lower leg: No edema.  Lymphadenopathy:     Cervical: Cervical adenopathy present.  Skin:    General: Skin is warm and dry.  Neurological:     Mental Status: She is alert and oriented to person, place, and time.  Psychiatric:        Mood and Affect: Mood normal.        Behavior: Behavior normal.        Thought Content: Thought content normal.        Judgment: Judgment normal.     Imaging: CT Soft Tissue Neck W Contrast  Result Date: 02/12/2022 CLINICAL DATA:  Initial evaluation for neck mass. EXAM: CT NECK WITH CONTRAST TECHNIQUE: Multidetector CT imaging of the neck was performed using the standard protocol following the bolus administration of intravenous contrast. RADIATION DOSE REDUCTION: This exam was performed according to the departmental dose-optimization program which includes automated exposure control, adjustment of the mA and/or kV according to patient size and/or use of  iterative reconstruction technique. CONTRAST:  19m OMNIPAQUE IOHEXOL 300 MG/ML  SOLN COMPARISON:  None Available. FINDINGS: Pharynx and larynx: Oral cavity within normal limits. Oropharynx and nasopharynx are normal. No retropharyngeal collection or swelling. Negative epiglottis. Hypopharynx and supraglottic larynx within normal limits. Glottis symmetric and normal. Subglottic airway patent clear. Salivary glands: Parotid and submandibular glands are within normal limits. Thyroid: Normal. Lymph nodes: Well-circumscribed ovoid soft tissue mass measuring 5.0 x 3.5 x 4.8 cm seen at the left  lower neck at the level of left level IV (series 3, image 80). Slight inferior and lateral extension towards the left supraclavicular fossa. Findings indeterminate, but favored to reflect an enlarged nodal mass/nodal conglomerate. No other enlarged or pathologic adenopathy within the neck. Vascular: Normal intravascular enhancement seen throughout the neck. Limited intracranial: Unremarkable. Visualized orbits: Unremarkable. Mastoids and visualized paranasal sinuses: Visualized paranasal sinuses are largely clear. Visualized mastoid air cells and middle ear cavities are well pneumatized and free of fluid. Skeleton: No discrete or worrisome osseous lesions. No significant spondylosis for age. Upper chest: Better evaluated in on concomitant CT of the chest. Other: None. IMPRESSION: 1. 5.0 x 3.5 x 4.8 cm ovoid soft tissue mass at the left lower neck as above, indeterminate, but favored to reflect an enlarged nodal mass/nodal conglomerate. Finding is indeterminate, with primary differential considerations including sequelae of lymphoproliferative disorder versus nodal metastatic disease. 2. No other evidence for malignancy elsewhere within the neck. 3. No other acute abnormality. Electronically Signed   By: Jeannine Boga M.D.   On: 02/12/2022 23:06   CT Chest W Contrast  Result Date: 02/12/2022 CLINICAL DATA:  Patient  complains of mass on the left side of her neck. EXAM: CT CHEST WITH CONTRAST TECHNIQUE: Multidetector CT imaging of the chest was performed during intravenous contrast administration. RADIATION DOSE REDUCTION: This exam was performed according to the departmental dose-optimization program which includes automated exposure control, adjustment of the mA and/or kV according to patient size and/or use of iterative reconstruction technique. CONTRAST:  12m OMNIPAQUE IOHEXOL 300 MG/ML  SOLN COMPARISON:  None Available. FINDINGS: Cardiovascular: No significant vascular findings. Normal heart size. No pericardial effusion. Mediastinum/Nodes: No enlarged mediastinal, hilar, or axillary lymph nodes. Thyroid gland, trachea, and esophagus demonstrate no significant findings. Lungs/Pleura: Lungs are clear. No pleural effusion or pneumothorax. Upper Abdomen: No acute abnormality. Musculoskeletal: A 5.1 cm x 4.1 cm x 4.7 cm well-defined homogeneous soft tissue mass (approximately 29.64 Hounsfield units) is seen within the chest wall/neck, just above the level of the left apex (axial CT images 1 through 16, CT series 3). This is located in between the posterior aspect of the left clavicle and anterolateral paravertebral soft tissues of C7 and extends inferiorly along the anterior aspect of the first left rib. No associated cortical destruction or pulmonary involvement is noted. No acute osseous abnormalities are identified. IMPRESSION: Soft tissue mass just above the level of the left apex, as described above, which corresponds to the area of palpable abnormality noted by the patient on physical examination. Further evaluation with contrast enhanced neck CT and/or MRI is recommended to exclude the presence of an underlying neoplasm. Electronically Signed   By: TVirgina NorfolkM.D.   On: 02/12/2022 22:28    Labs:  CBC: Recent Labs    02/12/22 1856 03/01/22 1120  WBC 9.1 6.6  HGB 12.3 13.2  HCT 40.3 43.1  PLT 321 315     COAGS: Recent Labs    03/01/22 1120  INR 1.0    BMP: Recent Labs    02/12/22 1856 03/01/22 1120  NA 137 139  K 3.9 4.2  CL 105 108  CO2 26 25  GLUCOSE 96 95  BUN 9 10  CALCIUM 8.5* 8.9  CREATININE 0.73 0.68  GFRNONAA >60 >60    LIVER FUNCTION TESTS: Recent Labs    02/12/22 1856  BILITOT 0.2*  AST 15  ALT 14  ALKPHOS 54  PROT 7.0  ALBUMIN 3.5    TUMOR MARKERS: No results  for input(s): "AFPTM", "CEA", "CA199", "CHROMGRNA" in the last 8760 hours.  Assessment and Plan: Pt with no significant medical history presented to ED 02/12/22 c/o left neck mass x 2 months. Pt was referred to ENT who has referred pt to IR for biopsy. Dr. Dwaine Gale approved left neck mass biopsy procedure.   Pt resting on bed with husband at bedside. Pt is A&O, calm and pleasant. She is in no distress. She is NPO per order.  Risks and benefits of left neck mass biopsy was discussed with the patient and/or patient's family including, but not limited to bleeding, infection, damage to adjacent structures or low yield requiring additional tests.  All of the questions were answered and there is agreement to proceed.  Consent signed and in chart.   Thank you for this interesting consult.  I greatly enjoyed meeting Stephanie Holmes and look forward to participating in their care.  A copy of this report was sent to the requesting provider on this date.  Electronically Signed: Tyson Alias, NP 03/01/2022, 12:17 PM   I spent a total of 20 minutes in face to face in clinical consultation, greater than 50% of which was counseling/coordinating care for cervical adenopathy.

## 2022-03-01 ENCOUNTER — Other Ambulatory Visit: Payer: Self-pay

## 2022-03-01 ENCOUNTER — Ambulatory Visit (HOSPITAL_COMMUNITY)
Admission: RE | Admit: 2022-03-01 | Discharge: 2022-03-01 | Disposition: A | Discharge status: Home / Self Care | Payer: Managed Care, Other (non HMO) | Source: Ambulatory Visit | Attending: Otolaryngology | Admitting: Otolaryngology

## 2022-03-01 ENCOUNTER — Ambulatory Visit (HOSPITAL_COMMUNITY)
Admission: RE | Admit: 2022-03-01 | Discharge: 2022-03-01 | Disposition: A | Discharge status: Home / Self Care | Payer: Managed Care, Other (non HMO) | Source: Ambulatory Visit | Attending: Otolaryngology

## 2022-03-01 ENCOUNTER — Encounter (HOSPITAL_COMMUNITY): Payer: Self-pay

## 2022-03-01 DIAGNOSIS — D3611 Benign neoplasm of peripheral nerves and autonomic nervous system of face, head, and neck: Secondary | ICD-10-CM | POA: Insufficient documentation

## 2022-03-01 DIAGNOSIS — R590 Localized enlarged lymph nodes: Secondary | ICD-10-CM | POA: Insufficient documentation

## 2022-03-01 DIAGNOSIS — R221 Localized swelling, mass and lump, neck: Secondary | ICD-10-CM

## 2022-03-01 LAB — BASIC METABOLIC PANEL
Anion gap: 6 (ref 5–15)
BUN: 10 mg/dL (ref 6–20)
CO2: 25 mmol/L (ref 22–32)
Calcium: 8.9 mg/dL (ref 8.9–10.3)
Chloride: 108 mmol/L (ref 98–111)
Creatinine, Ser: 0.68 mg/dL (ref 0.44–1.00)
GFR, Estimated: >60 mL/min (ref >60)
Glucose, Bld: 95 mg/dL (ref 70–99)
Potassium: 4.2 mmol/L (ref 3.5–5.1)
Sodium: 139 mmol/L (ref 135–145)

## 2022-03-01 LAB — CBC WITH DIFFERENTIAL/PLATELET
Abs Immature Granulocytes: 0.02 10*3/uL (ref 0.00–0.07)
Basophils Absolute: 0.1 10*3/uL (ref 0.0–0.1)
Basophils Relative: 1 %
Eosinophils Absolute: 0.5 10*3/uL (ref 0.0–0.5)
Eosinophils Relative: 7 %
HCT: 43.1 % (ref 36.0–46.0)
Hemoglobin: 13.2 g/dL (ref 12.0–15.0)
Immature Granulocytes: 0 %
Lymphocytes Relative: 35 %
Lymphs Abs: 2.3 10*3/uL (ref 0.7–4.0)
MCH: 25.4 pg — ABNORMAL LOW (ref 26.0–34.0)
MCHC: 30.6 g/dL (ref 30.0–36.0)
MCV: 83 fL (ref 80.0–100.0)
Monocytes Absolute: 0.4 10*3/uL (ref 0.1–1.0)
Monocytes Relative: 5 %
Neutro Abs: 3.4 10*3/uL (ref 1.7–7.7)
Neutrophils Relative %: 52 %
Platelets: 315 10*3/uL (ref 150–400)
RBC: 5.19 MIL/uL — ABNORMAL HIGH (ref 3.87–5.11)
RDW: 13.7 % (ref 11.5–15.5)
WBC: 6.6 10*3/uL (ref 4.0–10.5)
nRBC: 0 % (ref 0.0–0.2)

## 2022-03-01 LAB — PROTIME-INR
INR: 1 (ref 0.8–1.2)
Prothrombin Time: 12.7 seconds (ref 11.4–15.2)

## 2022-03-01 MED ORDER — LIDOCAINE HCL 1 % IJ SOLN
INTRAMUSCULAR | Status: AC
  Filled 2022-03-01: qty 20

## 2022-03-01 MED ORDER — MIDAZOLAM HCL 2 MG/2ML IJ SOLN
INTRAMUSCULAR | Status: AC | PRN
  Administered 2022-03-01 (×2): 1 mg via INTRAVENOUS

## 2022-03-01 MED ORDER — SODIUM CHLORIDE 0.9 % IV SOLN: INTRAVENOUS | Status: DC

## 2022-03-01 MED ORDER — MIDAZOLAM HCL 2 MG/2ML IJ SOLN
INTRAMUSCULAR | Status: AC
  Filled 2022-03-01: qty 2

## 2022-03-01 MED ORDER — FENTANYL CITRATE (PF) 100 MCG/2ML IJ SOLN
INTRAMUSCULAR | Status: AC
  Filled 2022-03-01: qty 2

## 2022-03-01 MED ORDER — FENTANYL CITRATE (PF) 100 MCG/2ML IJ SOLN
INTRAMUSCULAR | Status: AC | PRN
  Administered 2022-03-01 (×2): 50 ug via INTRAVENOUS

## 2022-03-01 NOTE — Procedures (Signed)
Interventional Radiology Procedure Note  Procedure: US guided biopsy of left supraclavicular neck mass Complications: None EBL: None Recommendations: - Bedrest 1 hours.   - Routine wound care - Follow up pathology - Advance diet   Signed,  Corrie Mckusick, DO

## 2022-03-01 NOTE — Discharge Instructions (Addendum)
Moderate Conscious Sedation, Adult, Care After This sheet gives you information about how to care for yourself after your procedure. Your health care provider may also give you more specific instructions. If you have problems or questions, contact your health care provider. What can I expect after the procedure? After the procedure, it is common to have: Sleepiness for several hours. Impaired judgment for several hours. Difficulty with balance. Vomiting if you eat too soon. Follow these instructions at home: For the time period you were told by your health care provider:     Rest. Do not participate in activities where you could fall or become injured. Do not drive or use machinery. Do not drink alcohol. Do not take sleeping pills or medicines that cause drowsiness. Do not make important decisions or sign legal documents. Do not take care of children on your own. Eating and drinking  Follow the diet recommended by your health care provider. Drink enough fluid to keep your urine pale yellow. If you vomit: Drink water, juice, or soup when you can drink without vomiting. Make sure you have little or no nausea before eating solid foods. General instructions Take over-the-counter and prescription medicines only as told by your health care provider. Have a responsible adult stay with you for the time you are told. It is important to have someone help care for you until you are awake and alert. Do not smoke. Keep all follow-up visits as told by your health care provider. This is important. Contact a health care provider if: You are still sleepy or having trouble with balance after 24 hours. You feel light-headed. You keep feeling nauseous or you keep vomiting. You develop a rash. You have a fever. You have redness or swelling around the IV site. Get help right away if: You have trouble breathing. You have new-onset confusion at home. Summary After the procedure, it is common to  feel sleepy, have impaired judgment, or feel nauseous if you eat too soon. Rest after you get home. Know the things you should not do after the procedure. Follow the diet recommended by your health care provider and drink enough fluid to keep your urine pale yellow. Get help right away if you have trouble breathing or new-onset confusion at home. This information is not intended to replace advice given to you by your health care provider. Make sure you discuss any questions you have with your health care provider. Document Revised: 11/05/2019 Document Reviewed: 06/03/2019 Elsevier Patient Education  2023 Elsevier Inc.      Needle Biopsy, Care After This sheet gives you information about how to care for yourself after your procedure. Your health care provider may also give you more specific instructions. If you have problems or questions, contact your health careprovider. What can I expect after the procedure? After the procedure, it is common to have soreness, bruising, or mild pain atthe puncture site. This should go away in a few days. Follow these instructions at home: Needle insertion site care Wash your hands with soap and water before you change your bandage (dressing). If you cannot use soap and water, use hand sanitizer. Follow instructions from your health care provider about how to take care of your puncture site. This includes: When and how to change your dressing. When to remove your dressing. Check your puncture site every day for signs of infection. Check for: Redness, swelling, or pain. Fluid or blood. Pus or a bad smell. Warmth.   General instructions Return to your normal activities   as told by your health care provider. Ask your health care provider what activities are safe for you. Do not take baths, swim, or use a hot tub until your health care provider approves. Ask your health care provider if you may take showers. You may only be allowed to take sponge baths. Take  over-the-counter and prescription medicines only as told by your health care provider. Keep all follow-up visits as told by your health care provider. This is important. Contact a health care provider if: You have a fever. You have redness, swelling, or pain at the puncture site that lasts longer than a few days. You have fluid, blood, or pus coming from your puncture site. Your puncture site feels warm to the touch. Get help right away if: You have severe bleeding from the puncture site. Summary After the procedure, it is common to have soreness, bruising, or mild pain at the puncture site. This should go away in a few days. Check your puncture site every day for signs of infection, such as redness, swelling, or pain. Get help right away if you have severe bleeding from your puncture site. This information is not intended to replace advice given to you by your health care provider. Make sure you discuss any questions you have with your healthcare provider. Document Revised: 01/06/2020 Document Reviewed: 01/06/2020 Elsevier Patient Education  2022 Elsevier Inc.     For questions /concerns may call Interventional Radiology clinic [phone]   You may remove your dressing and shower tomorrow afternoon.    

## 2022-03-04 LAB — SURGICAL PATHOLOGY

## 2023-07-17 ENCOUNTER — Encounter (HOSPITAL_COMMUNITY): Payer: Self-pay | Admitting: Obstetrics and Gynecology

## 2023-07-17 ENCOUNTER — Inpatient Hospital Stay (HOSPITAL_COMMUNITY)
Admission: AD | Admit: 2023-07-17 | Discharge: 2023-07-17 | Disposition: A | Discharge status: Home / Self Care | Payer: Managed Care, Other (non HMO) | Attending: Obstetrics and Gynecology | Admitting: Obstetrics and Gynecology

## 2023-07-17 DIAGNOSIS — Z3A01 Less than 8 weeks gestation of pregnancy: Secondary | ICD-10-CM | POA: Insufficient documentation

## 2023-07-17 DIAGNOSIS — Z3201 Encounter for pregnancy test, result positive: Secondary | ICD-10-CM | POA: Insufficient documentation

## 2023-07-17 LAB — POCT PREGNANCY, URINE: Preg Test, Ur: POSITIVE — AB

## 2023-07-17 NOTE — MAU Note (Signed)
.  Stephanie Holmes is a 32 y.o. at Unknown here in MAU reporting: pt had  expired explanon removed a the beginning of the month. Had unprotected intercourse  worried she might be pregnant. Has not had a period this month. Seeking termination if she is.  Denies any pain or discomfort just reports feeling tired and hungry all the time.   LMP: 06/08/23 Onset of complaint: today Pain score: 0 Vitals:   07/17/23 1701  BP: (!) 105/57  Pulse: 74  Resp: 18  Temp: (!) 97.5 F (36.4 C)     FHT:n/a Lab orders placed from triage: UPT

## 2023-07-17 NOTE — MAU Provider Note (Addendum)
History     CSN: [id number]  Arrival date and time: 07/17/23 1413   Event Date/Time   First Provider Initiated Contact with Patient 07/17/23 1744      Chief Complaint  Patient presents with   Possible Pregnancy   HPI Stephanie Holmes is a 32 y.o. year old [id number] female at [redacted]w[redacted]d weeks gestation by LMP who presents to MAU reporting she had Nexplanon removed 06/25/2023. She desired to have it replaced, but the provider ([facility] in [place], [place]) would not reinsert a Nexplanon until pregnancy test was confirmed negative since she had been having unprotected SI.  She reports she does not know how to work a home pregnancy test so she came here to have a pregnancy test done.  She did not have a period this month.  She states that if her pregnancy test is positive she does not desire to be pregnant and would want an abortion. Her partner is present and contributing to the history taking.   OB History     Gravida  5   Para  4   Term  3   Preterm  1   AB      Living  4      SAB      IAB      Ectopic      Multiple  0   Live Births  3           Past Medical History:  Diagnosis Date   Group B Streptococcus carrier, antepartum 02/16/2018   Late prenatal care 12/12/2017   NSVD (normal spontaneous vaginal delivery) 02/16/2018   Pregnancy induced hypertension    Preterm labor    Proteinuria affecting pregnancy 12/19/2017   Unsure if baseline. PCR ratio 370. No elevated BPs. H/o PPH.    Supervision of high risk pregnancy in second trimester 12/12/2017    Nursing Staff Provider Office Location  [facility] Dating   LMP c/w late 2nd trimester [place] Language  Pohnpeian Anatomy US   Normal Flu Vaccine   Genetic Screen  NIPS low risk  TDaP vaccine  12/26/17  Hgb A1C or  GTT Early too late Third trimester: normal Rhogam   N/A   LAB RESULTS  Feeding Plan Breast/Bottle Blood Type A/Positive/-- (05/10 1008)  Contraception unsure Antibody Negative (05/10 1008) Circumc    Past Surgical History:   Procedure Laterality Date   NO PAST SURGERIES      Family History  Problem Relation Age of Onset   Healthy Mother    Diabetes Father    Hypertension Father     Social History   Tobacco Use   Smoking status: Never   Smokeless tobacco: Never  Vaping Use   Vaping status: Never Used  Substance Use Topics   Alcohol use: No   Drug use: Never    Allergies: No Known Allergies  Medications Prior to Admission  Medication Sig Dispense Refill Last Dose/Taking   aspirin EC 81 MG tablet Take 1 tablet (81 mg total) by mouth daily. Take after 12 weeks for prevention of preeclampsia later in pregnancy 300 tablet 2    ibuprofen (ADVIL) 600 MG tablet Take 1 tablet (600 mg total) by mouth every 6 (six) hours. 56 tablet 0    naproxen (NAPROSYN) 500 MG tablet Take 1 tablet (500 mg total) by mouth 2 (two) times daily with a meal. 30 tablet 0    Prenatal Vit-Fe Fumarate-FA (PREPLUS) 27-1 MG TABS Take 1 tablet by mouth daily. 30  tablet 13    silver sulfADIAZINE (SILVADENE) 1 % cream Apply 1 application topically daily. 400 g 0     Review of Systems  Constitutional: Negative.   HENT: Negative.    Eyes: Negative.   Respiratory: Negative.    Cardiovascular: Negative.   Gastrointestinal: Negative.   Endocrine: Negative.   Genitourinary:        Amenorrhea  Musculoskeletal: Negative.   Skin: Negative.   Allergic/Immunologic: Negative.   Neurological: Negative.   Hematological: Negative.   Psychiatric/Behavioral: Negative.     Physical Exam   Blood pressure (!) 105/57, pulse 74, temperature (!) 97.5 F (36.4 C), resp. rate 18, height 5\' 6"  (1.676 m), weight 94.8 kg, last menstrual period 06/08/2023, unknown if currently breastfeeding.  Physical Exam Vitals and nursing note reviewed.  Constitutional:      Appearance: Normal appearance. She is obese.  Cardiovascular:     Rate and Rhythm: Normal rate.  Pulmonary:     Effort: Pulmonary effort is normal.  Genitourinary:    Comments:  deferred Musculoskeletal:        General: Normal range of motion.  Skin:    General: Skin is warm and dry.  Neurological:     Mental Status: She is alert and oriented to person, place, and time.  Psychiatric:        Mood and Affect: Mood normal.        Behavior: Behavior normal.        Thought Content: Thought content normal.        Judgment: Judgment normal.    MAU Course  Procedures  MDM UPT  Results for orders placed or performed during the hospital encounter of 07/17/23 (from the past 24 hours)  Pregnancy, urine POC     Status: Abnormal   Collection Time: 07/17/23  2:45 PM  Result Value Ref Range   Preg Test, Ur POSITIVE (A) NEGATIVE    Assessment and Plan   1. Positive pregnancy test   2. [redacted] weeks gestation of pregnancy   - Advised if pregnancy is undesired,  research locations for termination, [place] doesnot offer termination of pregnancy at this hospital - Patient verbalized an understanding of the plan of care and agrees.   Raelyn Mora, CNM 07/17/2023, 5:44 PM

## 2023-08-09 ENCOUNTER — Encounter (HOSPITAL_COMMUNITY): Payer: Self-pay

## 2023-08-09 ENCOUNTER — Ambulatory Visit (HOSPITAL_COMMUNITY)
Admission: EM | Admit: 2023-08-09 | Discharge: 2023-08-09 | Disposition: A | Discharge status: Home / Self Care | Payer: Managed Care, Other (non HMO) | Attending: Emergency Medicine | Admitting: Emergency Medicine

## 2023-08-09 DIAGNOSIS — R051 Acute cough: Secondary | ICD-10-CM

## 2023-08-09 DIAGNOSIS — J209 Acute bronchitis, unspecified: Secondary | ICD-10-CM | POA: Diagnosis not present

## 2023-08-09 MED ORDER — PREDNISONE 10 MG PO TABS: 20.0000 mg | ORAL_TABLET | Freq: Every day | ORAL | 0 refills | Status: AC

## 2023-08-09 MED ORDER — AZITHROMYCIN 250 MG PO TABS: 250.0000 mg | ORAL_TABLET | Freq: Every day | ORAL | 0 refills | Status: AC

## 2023-08-09 MED ORDER — BENZONATATE 100 MG PO CAPS: ORAL_CAPSULE | ORAL | 0 refills | Status: AC

## 2023-08-09 NOTE — Discharge Instructions (Addendum)
Take medications as ordered. Medications may be purchased OTC if insurance does not cover them. Salt water gargles Motrin 600mg  or Tylenol 1000mg  three times a day for generalized body aches and fever Ensure hydration Consider adding on Zinc and Vit C for immunity

## 2023-08-09 NOTE — ED Provider Notes (Addendum)
[facility]    CSN: [id number] Arrival date & time: 08/09/23  1002      History   Chief Complaint Chief Complaint  Patient presents with   Cough    HPI Stephanie Holmes is a 33 y.o. female.   Patient is reporting being sick x 1 week.  First today she had a temperature of 101.  She is no longer febrile.  Continues with cough mucus production patient reporting vomiting secondary to the cough.  She denies any ear pain she denies any facial pain.   Cough   Past Medical History:  Diagnosis Date   Group B Streptococcus carrier, antepartum 02/16/2018   Late prenatal care 12/12/2017   NSVD (normal spontaneous vaginal delivery) 02/16/2018   Pregnancy induced hypertension    Preterm labor    Proteinuria affecting pregnancy 12/19/2017   Unsure if baseline. PCR ratio 370. No elevated BPs. H/o PPH.    Supervision of high risk pregnancy in second trimester 12/12/2017    Nursing Staff Provider Office Location  [facility] Dating   LMP c/w late 2nd trimester [place] Language  [employer] An[employer] US   Normal Flu Vaccine   Genetic Screen  NIPS low risk  TDaP vaccine  12/26/17  Hgb A1C or  GTT Early too late Third trimester: normal Rhogam   N/A   LAB RESULTS  Feeding Plan Breast/Bottle Blood Type A/Positive/-- (05/10 1008)  Contraception unsure Antibody Negative (05/10 1008) Circumc    Patient Active Problem List   Diagnosis Date Noted   Labor and delivery, indication for care 09/27/2019   Supervision of high risk pregnancy, antepartum 07/05/2019   History of pre-eclampsia in prior pregnancy, currently pregnant 12/12/2017   History of preterm labor 12/12/2017   History of postpartum hemorrhage 12/12/2017   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 Authorizing Provider  azithromycin (ZITHROMAX) 250 MG tablet Take 1 tablet (250 mg total) by mouth daily. Take first 2 tablets together, then 1 every day until finished. 08/09/23  Yes Elira Colasanti, Linde Gillis, NP  benzonatate (TESSALON) 100 MG capsule 1-2 tabs every 8 hours  as needed for cough 08/09/23  Yes Attie Nawabi, Linde Gillis, NP  predniSONE (DELTASONE) 10 MG tablet Take 2 tablets (20 mg total) by mouth daily. 08/09/23  Yes Somer Trotter, Linde Gillis, NP  aspirin EC 81 MG tablet Take 1 tablet (81 mg total) by mouth daily. Take after 12 weeks for prevention of preeclampsia later in pregnancy 06/23/19   Calvert Cantor, CNM  Prenatal Vit-Fe Fumarate-FA (PREPLUS) 27-1 MG TABS Take 1 tablet by mouth daily. 06/23/19   Calvert Cantor, CNM    Family History Family History  Problem Relation Age of Onset   Healthy Mother    Diabetes Father    Hypertension Father  Social History Social History   Tobacco Use   Smoking status: Never   Smokeless tobacco: Never  Vaping Use   Vaping status: Never Used  Substance Use Topics   Alcohol use: No   Drug use: Never     Allergies   Patient has no known allergies.   Review of Systems Review of Systems  Respiratory:  Positive for cough.      Physical Exam Triage Vital Signs ED Triage Vitals  Encounter Vitals Group     BP 08/09/23 1023 117/83     Systolic BP Percentile --      Diastolic BP Percentile --      Pulse Rate 08/09/23 1023 76     Resp 08/09/23 1023 16     Temp 08/09/23 1023 97.9 F (36.6 C)     Temp Source 08/09/23 1023 Oral     SpO2 08/09/23 1023 99 %     Weight --      Height --      Head Circumference --      Peak Flow --      Pain Score 08/09/23 1029 0     Pain Loc --      Pain Education --      Exclude from Growth Chart --    No data found.  Updated Vital  Signs BP 117/83 (BP Location: Left Arm)   Pulse 76   Temp 97.9 F (36.6 C) (Oral)   Resp 16   LMP 06/08/2023 Comment: Miscarried last week (Approx. 07/28/23 -08/01/23) followed by OBGYN.  SpO2 99%   Breastfeeding Unknown   Visual Acuity Right Eye Distance:   Left Eye Distance:   Bilateral Distance:    Right Eye Near:   Left Eye Near:    Bilateral Near:     Physical Exam Vitals and nursing note reviewed.  HENT:     Right Ear: Tympanic membrane is injected.     Left Ear: Tympanic membrane is retracted.  Pulmonary:     Breath sounds: Decreased air movement present. Examination of the right-lower field reveals decreased breath sounds. Examination of the left-lower field reveals decreased breath sounds. Decreased breath sounds present.  Neurological:     Mental Status: She is alert.      UC Treatments / Results  Labs (all labs ordered are listed, but only abnormal results are displayed) Labs Reviewed - No data to display  EKG   Radiology No results found.  Procedures Procedures (including critical care time)  Medications Ordered in UC Medications - No data to display  Initial Impression / Assessment and Plan / UC Course  I have reviewed the triage vital signs and the nursing notes.  Pertinent labs & imaging results that were available during my care of the patient were reviewed by me and considered in my medical decision making (see chart for details).   Patient is reporting being sick x 1 week.  First today she had a temperature of 101.  She is no longer febrile.  Continues with cough mucus production patient reporting vomiting secondary to the cough.  She denies any ear pain she denies any facial pain..    Pregnancy status verified - no longer pregnant.  She spontaneously miscarried last week and is currently on medication and under the care of OB.    We will treat for bronchitis at this time.  Final Clinical Impressions(s) / UC Diagnoses   Final diagnoses:   Acute bronchitis, unspecified organism  Acute cough  Discharge Instructions      Take medications as ordered. Medications may be purchased OTC if insurance does not cover them. Salt water gargles Motrin 600mg  or Tylenol 1000mg  three times a day for generalized body aches and fever Ensure hydration Consider adding on Zinc and Vit C for immunity       ED Prescriptions     Medication Sig Dispense Auth. Provider   predniSONE (DELTASONE) 10 MG tablet Take 2 tablets (20 mg total) by mouth daily. 10 tablet Cianni Manny, Linde Gillis, NP   azithromycin (ZITHROMAX) 250 MG tablet Take 1 tablet (250 mg total) by mouth daily. Take first 2 tablets together, then 1 every day until finished. 6 tablet Jamis Kryder M, NP   benzonatate (TESSALON) 100 MG capsule 1-2 tabs every 8 hours  as needed for cough 30 capsule Janece Laidlaw, Linde Gillis, NP      PDMP not reviewed this encounter.   Nelda Marseille, NP 08/09/23 1107    Nelda Marseille, NP 08/09/23 [phone]

## 2023-08-09 NOTE — ED Triage Notes (Signed)
Patient presents to the office for sore throat. Cough and nasal congestion x 1 week.
